# Patient Record
Sex: Female | Born: 1950 | Race: White | Hispanic: No | Marital: Married | Smoking: Former smoker
Health system: Southern US, Community
[De-identification: ages and names within clinical notes are randomized; demographics above are authoritative.]

## PROBLEM LIST (undated history)

## (undated) DIAGNOSIS — Z79890 Hormone replacement therapy (postmenopausal): Secondary | ICD-10-CM

## (undated) DIAGNOSIS — F419 Anxiety disorder, unspecified: Secondary | ICD-10-CM

## (undated) DIAGNOSIS — E785 Hyperlipidemia, unspecified: Secondary | ICD-10-CM

## (undated) DIAGNOSIS — G250 Essential tremor: Secondary | ICD-10-CM

## (undated) DIAGNOSIS — T7840XA Allergy, unspecified, initial encounter: Secondary | ICD-10-CM

## (undated) DIAGNOSIS — F410 Panic disorder [episodic paroxysmal anxiety] without agoraphobia: Secondary | ICD-10-CM

## (undated) HISTORY — DX: Hormone replacement therapy: Z79.890

## (undated) HISTORY — DX: Anxiety disorder, unspecified: F41.9

## (undated) HISTORY — DX: Allergy, unspecified, initial encounter: T78.40XA

## (undated) HISTORY — PX: TONSILLECTOMY: SUR1361

## (undated) HISTORY — DX: Panic disorder (episodic paroxysmal anxiety): F41.0

## (undated) HISTORY — DX: Essential tremor: G25.0

## (undated) HISTORY — DX: Hyperlipidemia, unspecified: E78.5

---

## 2016-12-02 ENCOUNTER — Other Ambulatory Visit: Payer: Self-pay | Admitting: Internal Medicine

## 2016-12-02 DIAGNOSIS — Z130 Encounter for screening for diseases of the blood and blood-forming organs and certain disorders involving the immune mechanism: Secondary | ICD-10-CM

## 2016-12-02 DIAGNOSIS — Z1329 Encounter for screening for other suspected endocrine disorder: Secondary | ICD-10-CM

## 2016-12-02 DIAGNOSIS — Z0000 Encounter for general adult medical examination without abnormal findings: Secondary | ICD-10-CM

## 2016-12-02 DIAGNOSIS — Z13220 Encounter for screening for lipoid disorders: Secondary | ICD-10-CM

## 2016-12-02 DIAGNOSIS — Z1321 Encounter for screening for nutritional disorder: Secondary | ICD-10-CM

## 2016-12-02 LAB — COMPREHENSIVE METABOLIC PANEL
ALBUMIN: 4.2 g/dL (ref 3.6–5.1)
ALT: 26 U/L (ref 6–29)
AST: 20 U/L (ref 10–35)
Alkaline Phosphatase: 28 U/L — ABNORMAL LOW (ref 33–130)
BUN: 14 mg/dL (ref 7–25)
CALCIUM: 8.8 mg/dL (ref 8.6–10.4)
CHLORIDE: 103 mmol/L (ref 98–110)
CO2: 31 mmol/L (ref 20–31)
CREATININE: 0.76 mg/dL (ref 0.50–0.99)
Glucose, Bld: 87 mg/dL (ref 65–99)
POTASSIUM: 4 mmol/L (ref 3.5–5.3)
Sodium: 139 mmol/L (ref 135–146)
TOTAL PROTEIN: 6.7 g/dL (ref 6.1–8.1)
Total Bilirubin: 0.6 mg/dL (ref 0.2–1.2)

## 2016-12-02 LAB — CBC WITH DIFFERENTIAL/PLATELET
BASOS ABS: 0 {cells}/uL (ref 0–200)
Basophils Relative: 0 %
EOS ABS: 90 {cells}/uL (ref 15–500)
Eosinophils Relative: 2 %
HEMATOCRIT: 40.4 % (ref 35.0–45.0)
Hemoglobin: 13.2 g/dL (ref 11.7–15.5)
LYMPHS PCT: 37 %
Lymphs Abs: 1665 cells/uL (ref 850–3900)
MCH: 29.7 pg (ref 27.0–33.0)
MCHC: 32.7 g/dL (ref 32.0–36.0)
MCV: 91 fL (ref 80.0–100.0)
MONO ABS: 225 {cells}/uL (ref 200–950)
MPV: 9.9 fL (ref 7.5–12.5)
Monocytes Relative: 5 %
NEUTROS PCT: 56 %
Neutro Abs: 2520 cells/uL (ref 1500–7800)
Platelets: 150 10*3/uL (ref 140–400)
RBC: 4.44 MIL/uL (ref 3.80–5.10)
RDW: 13.5 % (ref 11.0–15.0)
WBC: 4.5 10*3/uL (ref 3.8–10.8)

## 2016-12-02 LAB — LIPID PANEL
Cholesterol: 212 mg/dL — ABNORMAL HIGH (ref <200)
HDL: 63 mg/dL (ref >50)
LDL CALC: 135 mg/dL — AB (ref <100)
Total CHOL/HDL Ratio: 3.4 Ratio (ref <5.0)
Triglycerides: 70 mg/dL (ref <150)
VLDL: 14 mg/dL (ref <30)

## 2016-12-02 LAB — TSH: TSH: 3.6 mIU/L

## 2016-12-03 LAB — VITAMIN D 25 HYDROXY (VIT D DEFICIENCY, FRACTURES): VIT D 25 HYDROXY: 57 ng/mL (ref 30–100)

## 2016-12-04 ENCOUNTER — Ambulatory Visit (INDEPENDENT_AMBULATORY_CARE_PROVIDER_SITE_OTHER): Payer: BLUE CROSS/BLUE SHIELD | Admitting: Internal Medicine

## 2016-12-04 ENCOUNTER — Encounter: Payer: Self-pay | Admitting: Internal Medicine

## 2016-12-04 VITALS — BP 108/80 | HR 79 | Temp 98.8°F | Ht 63.25 in | Wt 136.0 lb

## 2016-12-04 DIAGNOSIS — E7800 Pure hypercholesterolemia, unspecified: Secondary | ICD-10-CM | POA: Diagnosis not present

## 2016-12-04 DIAGNOSIS — E2839 Other primary ovarian failure: Secondary | ICD-10-CM

## 2016-12-04 DIAGNOSIS — Z0000 Encounter for general adult medical examination without abnormal findings: Secondary | ICD-10-CM

## 2016-12-04 DIAGNOSIS — Z8659 Personal history of other mental and behavioral disorders: Secondary | ICD-10-CM | POA: Diagnosis not present

## 2016-12-04 DIAGNOSIS — G250 Essential tremor: Secondary | ICD-10-CM | POA: Diagnosis not present

## 2016-12-04 LAB — POCT URINALYSIS DIPSTICK
BILIRUBIN UA: NEGATIVE
Blood, UA: NEGATIVE
Glucose, UA: NEGATIVE
KETONES UA: NEGATIVE
LEUKOCYTES UA: NEGATIVE
Nitrite, UA: NEGATIVE
PH UA: 6.5
Protein, UA: NEGATIVE
SPEC GRAV UA: 1.015
Urobilinogen, UA: NEGATIVE

## 2016-12-04 NOTE — Progress Notes (Signed)
Subjective:    Patient ID: Sabrina KingfisherDiana Drumheller, female    DOB: 02/22/1951, 65 y.o.   MRN: [id number]  HPI  65 year old Female moved here from [place], [place] several months ago after retiring. She is here to establish care.  She had a colonoscopy 11/12/2015 with Dr. Evelene CroonAltschuler in [place]. Findings included mild sigmoid diverticulosis, internal hemorrhoid.  Her sister had colon cancer.  She declines flu vaccine.  No recent tetanus immunization that she is aware of.  Mammogram May 2016  She has been on bioidentical hormone cream for some time.  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  Family history: Father living with history of throat cancer. Father also has essential tremor Mother died at age 64 years of age due to complications of dementia. One brother with history of prostate cancer age 64. Sister with history of colon cancer age 64.  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 She is married. Social alcohol consumption. Is a former smoker but hasn't smoked in over 10 years. No children  Allergic to pollen, latex, contrast dye.  No history of serious illnesses accidents or operations.  No known drug allergies. Takes one half of a 20 mg Celexa tablet daily. Is not on statin medication despite history of hyperlipidemia. Total cholesterol was 238 in 2016. HDL cholesterol was 60. Triglycerides were 126. LDL cholesterol was 153.  She takes a number of supplements.  Does not want to be treated for central tremor. It doesn't interfere that much with her activities.  She is a native of [place].       Review of Systems see above     Objective:   Physical Exam  Constitutional: She is oriented to person, place, and time. She appears well-developed and well-nourished. No distress.  HENT:  Head: Normocephalic and atraumatic.  Right  Ear: External ear normal.  Left Ear: External ear normal.  Mouth/Throat: Oropharynx is clear and moist.  Eyes: Conjunctivae and EOM are normal. Pupils are equal, round, and reactive to light. Right eye exhibits no discharge. Left eye exhibits no discharge. No scleral icterus.  Neck: Neck supple. No JVD present. No thyromegaly present.  Cardiovascular: Normal rate, regular rhythm, normal heart sounds and intact distal pulses.   No murmur heard. Pulmonary/Chest: Effort normal and breath sounds normal. She has no wheezes.  Abdominal: Soft. Bowel sounds are normal. She exhibits no distension. There is no tenderness. There is no guarding.  Genitourinary:  Genitourinary Comments: Deferred to GYN  Musculoskeletal: She exhibits no edema.  Lymphadenopathy:    She has no cervical adenopathy.  Neurological: She is alert and oriented to person, place, and time. She has normal reflexes. No cranial nerve deficit. Coordination normal.  Skin: Skin is dry. No rash noted. She is not diaphoretic.  Psychiatric: She has a normal mood and affect. Her behavior is normal. Thought content normal.  Vitals reviewed.         Assessment & Plan:  Normal health maintenance exam  Hyperlipidemia-controlled with supplements. Patient will return in 6 months and have repeat lipid panel for follow-up  Estrogen replacement with bioidentical hormones-she is looking for GYN who will prescribe these for her. Have given her names of several GYN offices in the area and maybe they can help her with this.  History of depression-treated with citalopram 20 mg-she takes one half tablet daily  Family history of colon cancer  and sister-not due for colonoscopy at this point in time. Gastroenterologist in [place] had recommended 5 year follow-up from last colonoscopy due to family history.  Plan: Return in 6 months for follow-up on hyperlipidemia. Order written for mammogram and bone density study.

## 2016-12-04 NOTE — Patient Instructions (Addendum)
OK to RF SSRI when due. Check lipid panel in 6 months. It was a pleasure to see you today.

## 2017-01-27 ENCOUNTER — Other Ambulatory Visit: Payer: Self-pay | Admitting: Internal Medicine

## 2017-01-27 DIAGNOSIS — Z1231 Encounter for screening mammogram for malignant neoplasm of breast: Secondary | ICD-10-CM

## 2017-02-13 ENCOUNTER — Ambulatory Visit
Admission: RE | Admit: 2017-02-13 | Discharge: 2017-02-13 | Disposition: A | Discharge status: Home / Self Care | Payer: BLUE CROSS/BLUE SHIELD | Source: Ambulatory Visit | Attending: Internal Medicine | Admitting: Internal Medicine

## 2017-02-13 DIAGNOSIS — E2839 Other primary ovarian failure: Secondary | ICD-10-CM

## 2017-02-13 DIAGNOSIS — Z1231 Encounter for screening mammogram for malignant neoplasm of breast: Secondary | ICD-10-CM

## 2017-04-29 ENCOUNTER — Telehealth: Payer: Self-pay

## 2017-04-29 MED ORDER — CITALOPRAM HYDROBROMIDE 20 MG PO TABS: ORAL_TABLET | ORAL | 0 refills | Status: DC

## 2017-04-29 NOTE — Telephone Encounter (Signed)
Refill once #30 

## 2017-04-29 NOTE — Telephone Encounter (Signed)
Patient called requesting that her Celexa be filled, she said it would be the first time being filled by you. [facility] [address]. Has a 6 month f/up in 05/2017.

## 2017-04-29 NOTE — Telephone Encounter (Signed)
Done for #30 and called patient and informed her.

## 2017-05-27 ENCOUNTER — Other Ambulatory Visit: Payer: Self-pay | Admitting: Internal Medicine

## 2017-05-27 NOTE — Telephone Encounter (Signed)
Please call pharmacy. Pt told Marcelino DusterMichelle she did not need it. [facility] so there is no confusion please.

## 2017-06-01 ENCOUNTER — Other Ambulatory Visit: Payer: BLUE CROSS/BLUE SHIELD | Admitting: Internal Medicine

## 2017-06-01 DIAGNOSIS — E785 Hyperlipidemia, unspecified: Secondary | ICD-10-CM

## 2017-06-01 LAB — LIPID PANEL
CHOLESTEROL: 190 mg/dL (ref <200)
HDL: 51 mg/dL (ref >50)
LDL Cholesterol: 121 mg/dL — ABNORMAL HIGH (ref <100)
TRIGLYCERIDES: 91 mg/dL (ref <150)
Total CHOL/HDL Ratio: 3.7 Ratio (ref <5.0)
VLDL: 18 mg/dL (ref <30)

## 2017-06-05 ENCOUNTER — Encounter: Payer: Self-pay | Admitting: Internal Medicine

## 2017-06-05 ENCOUNTER — Ambulatory Visit (INDEPENDENT_AMBULATORY_CARE_PROVIDER_SITE_OTHER): Payer: BLUE CROSS/BLUE SHIELD | Admitting: Internal Medicine

## 2017-06-05 VITALS — BP 98/60 | HR 77 | Temp 98.2°F | Wt 137.0 lb

## 2017-06-05 DIAGNOSIS — Z8659 Personal history of other mental and behavioral disorders: Secondary | ICD-10-CM | POA: Diagnosis not present

## 2017-06-05 DIAGNOSIS — E7800 Pure hypercholesterolemia, unspecified: Secondary | ICD-10-CM

## 2017-06-05 NOTE — Patient Instructions (Signed)
It was a pleasure to see you today. Continue with diet and exercise efforts and return January 2019 for Welcome to Medicare physical exam.

## 2017-06-05 NOTE — Progress Notes (Signed)
   Subjective:    Patient ID: Sabrina Price, female    DOB: 02/07/1951, 66 y.o.   MRN: [id number]  HPI She remains on Celexa. Doesn't need refill just yet. Willing to do 90 day refills when drugstore recontacts us.  Here today to follow-up on hyperlipidemia. 6 months ago total cholesterol was 212 and is now 190. LDL cholesterol was 135 and is now 121. She exercises 6 days a week and maintains her weight. She looks good and is enjoying her summer.    Review of Systems see above no new complaints. She will be going on Medicare in October     Objective:   Physical Exam Not examined. Spent 15 minutes speaking with her about these issues       Assessment & Plan:  Mild elevation of LDL  Plan: Continue diet and exercise regimen. She does not want be on statin medication. Physical exam booked for January 2019. Continue same medications

## 2017-07-02 ENCOUNTER — Other Ambulatory Visit: Payer: Self-pay | Admitting: Internal Medicine

## 2017-07-03 ENCOUNTER — Telehealth: Payer: Self-pay | Admitting: Internal Medicine

## 2017-07-07 ENCOUNTER — Other Ambulatory Visit: Payer: Self-pay | Admitting: Internal Medicine

## 2017-12-07 ENCOUNTER — Other Ambulatory Visit: Payer: Self-pay | Admitting: Internal Medicine

## 2017-12-07 DIAGNOSIS — Z1321 Encounter for screening for nutritional disorder: Secondary | ICD-10-CM

## 2017-12-07 DIAGNOSIS — Z1329 Encounter for screening for other suspected endocrine disorder: Secondary | ICD-10-CM

## 2017-12-07 DIAGNOSIS — Z13220 Encounter for screening for lipoid disorders: Secondary | ICD-10-CM

## 2017-12-07 DIAGNOSIS — Z0000 Encounter for general adult medical examination without abnormal findings: Secondary | ICD-10-CM

## 2017-12-21 ENCOUNTER — Other Ambulatory Visit: Payer: Medicare HMO | Admitting: Internal Medicine

## 2017-12-21 DIAGNOSIS — Z1321 Encounter for screening for nutritional disorder: Secondary | ICD-10-CM

## 2017-12-21 DIAGNOSIS — Z13220 Encounter for screening for lipoid disorders: Secondary | ICD-10-CM | POA: Diagnosis not present

## 2017-12-21 DIAGNOSIS — Z1329 Encounter for screening for other suspected endocrine disorder: Secondary | ICD-10-CM | POA: Diagnosis not present

## 2017-12-21 DIAGNOSIS — G250 Essential tremor: Secondary | ICD-10-CM | POA: Diagnosis not present

## 2017-12-21 DIAGNOSIS — E2839 Other primary ovarian failure: Secondary | ICD-10-CM | POA: Diagnosis not present

## 2017-12-21 DIAGNOSIS — Z0000 Encounter for general adult medical examination without abnormal findings: Secondary | ICD-10-CM

## 2017-12-21 DIAGNOSIS — E785 Hyperlipidemia, unspecified: Secondary | ICD-10-CM | POA: Diagnosis not present

## 2017-12-21 DIAGNOSIS — E7800 Pure hypercholesterolemia, unspecified: Secondary | ICD-10-CM | POA: Diagnosis not present

## 2017-12-22 LAB — CBC WITH DIFFERENTIAL/PLATELET
BASOS PCT: 0.4 %
Basophils Absolute: 19 cells/uL (ref 0–200)
EOS ABS: 192 {cells}/uL (ref 15–500)
Eosinophils Relative: 4 %
HCT: 39.4 % (ref 35.0–45.0)
Hemoglobin: 13.3 g/dL (ref 11.7–15.5)
Lymphs Abs: 1493 cells/uL (ref 850–3900)
MCH: 29.5 pg (ref 27.0–33.0)
MCHC: 33.8 g/dL (ref 32.0–36.0)
MCV: 87.4 fL (ref 80.0–100.0)
MONOS PCT: 8 %
MPV: 10.8 fL (ref 7.5–12.5)
NEUTROS PCT: 56.5 %
Neutro Abs: 2712 cells/uL (ref 1500–7800)
PLATELETS: 161 10*3/uL (ref 140–400)
RBC: 4.51 10*6/uL (ref 3.80–5.10)
RDW: 12.4 % (ref 11.0–15.0)
TOTAL LYMPHOCYTE: 31.1 %
WBC mixed population: 384 cells/uL (ref 200–950)
WBC: 4.8 10*3/uL (ref 3.8–10.8)

## 2017-12-22 LAB — LIPID PANEL
CHOL/HDL RATIO: 3.4 (calc) (ref <5.0)
CHOLESTEROL: 225 mg/dL — AB (ref <200)
HDL: 67 mg/dL (ref >50)
LDL CHOLESTEROL (CALC): 139 mg/dL — AB
Non-HDL Cholesterol (Calc): 158 mg/dL (calc) — ABNORMAL HIGH (ref <130)
TRIGLYCERIDES: 86 mg/dL (ref <150)

## 2017-12-22 LAB — COMPLETE METABOLIC PANEL WITH GFR
AG RATIO: 1.6 (calc) (ref 1.0–2.5)
ALKALINE PHOSPHATASE (APISO): 32 U/L — AB (ref 33–130)
ALT: 22 U/L (ref 6–29)
AST: 20 U/L (ref 10–35)
Albumin: 4.5 g/dL (ref 3.6–5.1)
BUN: 18 mg/dL (ref 7–25)
CO2: 33 mmol/L — ABNORMAL HIGH (ref 20–32)
Calcium: 9.5 mg/dL (ref 8.6–10.4)
Chloride: 102 mmol/L (ref 98–110)
Creat: 0.86 mg/dL (ref 0.50–0.99)
GFR, Est African American: 82 mL/min/{1.73_m2} (ref >=60)
GFR, Est Non African American: 71 mL/min/{1.73_m2} (ref >=60)
GLOBULIN: 2.9 g/dL (ref 1.9–3.7)
Glucose, Bld: 92 mg/dL (ref 65–99)
POTASSIUM: 4.4 mmol/L (ref 3.5–5.3)
SODIUM: 140 mmol/L (ref 135–146)
Total Bilirubin: 0.6 mg/dL (ref 0.2–1.2)
Total Protein: 7.4 g/dL (ref 6.1–8.1)

## 2017-12-22 LAB — VITAMIN D 25 HYDROXY (VIT D DEFICIENCY, FRACTURES): Vit D, 25-Hydroxy: 61 ng/mL (ref 30–100)

## 2017-12-22 LAB — TSH: TSH: 2.71 mIU/L (ref 0.40–4.50)

## 2017-12-25 ENCOUNTER — Encounter: Payer: BLUE CROSS/BLUE SHIELD | Admitting: Internal Medicine

## 2018-01-11 ENCOUNTER — Telehealth: Payer: Self-pay | Admitting: Internal Medicine

## 2018-01-11 ENCOUNTER — Encounter: Payer: Self-pay | Admitting: Internal Medicine

## 2018-01-11 ENCOUNTER — Other Ambulatory Visit: Payer: Self-pay | Admitting: Internal Medicine

## 2018-01-11 DIAGNOSIS — Z139 Encounter for screening, unspecified: Secondary | ICD-10-CM

## 2018-01-11 MED ORDER — CITALOPRAM HYDROBROMIDE 20 MG PO TABS: ORAL_TABLET | ORAL | 0 refills | Status: DC

## 2018-01-11 NOTE — Telephone Encounter (Signed)
Pharmacy requesting refill on generic Celexa 20 mg 1/2-1 tablet daily.  Has appointment for physical exam February 22, 2018.  Authorized refill for #90 with no refill.

## 2018-02-15 ENCOUNTER — Ambulatory Visit
Admission: RE | Admit: 2018-02-15 | Discharge: 2018-02-15 | Disposition: A | Discharge status: Home / Self Care | Payer: BLUE CROSS/BLUE SHIELD | Source: Ambulatory Visit | Attending: Internal Medicine | Admitting: Internal Medicine

## 2018-02-15 DIAGNOSIS — Z1231 Encounter for screening mammogram for malignant neoplasm of breast: Secondary | ICD-10-CM | POA: Diagnosis not present

## 2018-02-15 DIAGNOSIS — Z139 Encounter for screening, unspecified: Secondary | ICD-10-CM

## 2018-02-22 ENCOUNTER — Other Ambulatory Visit (HOSPITAL_COMMUNITY)
Admission: RE | Admit: 2018-02-22 | Discharge: 2018-02-22 | Disposition: A | Discharge status: Home / Self Care | Payer: Medicare HMO | Source: Ambulatory Visit | Attending: Internal Medicine | Admitting: Internal Medicine

## 2018-02-22 ENCOUNTER — Encounter: Payer: Self-pay | Admitting: Internal Medicine

## 2018-02-22 ENCOUNTER — Ambulatory Visit (INDEPENDENT_AMBULATORY_CARE_PROVIDER_SITE_OTHER): Payer: Medicare HMO | Admitting: Internal Medicine

## 2018-02-22 VITALS — BP 102/80 | HR 66 | Ht 63.0 in | Wt 137.0 lb

## 2018-02-22 DIAGNOSIS — Z124 Encounter for screening for malignant neoplasm of cervix: Secondary | ICD-10-CM

## 2018-02-22 DIAGNOSIS — R251 Tremor, unspecified: Secondary | ICD-10-CM | POA: Diagnosis not present

## 2018-02-22 DIAGNOSIS — Z8659 Personal history of other mental and behavioral disorders: Secondary | ICD-10-CM

## 2018-02-22 DIAGNOSIS — Z0000 Encounter for general adult medical examination without abnormal findings: Secondary | ICD-10-CM | POA: Diagnosis not present

## 2018-02-22 DIAGNOSIS — E7800 Pure hypercholesterolemia, unspecified: Secondary | ICD-10-CM | POA: Diagnosis not present

## 2018-02-22 LAB — POCT URINALYSIS DIPSTICK
APPEARANCE: NORMAL
Bilirubin, UA: NEGATIVE
Blood, UA: NEGATIVE
Glucose, UA: NEGATIVE
Ketones, UA: NEGATIVE
LEUKOCYTES UA: NEGATIVE
NITRITE UA: NEGATIVE
ODOR: NORMAL
PH UA: 6.5 (ref 5.0–8.0)
PROTEIN UA: NEGATIVE
Spec Grav, UA: 1.015 (ref 1.010–1.025)
UROBILINOGEN UA: 0.2 U/dL

## 2018-02-22 NOTE — Progress Notes (Signed)
Subjective:    Patient ID: Sabrina Price, female    DOB: Mar 14, 1951, 67 y.o.   MRN: [id number]  HPI 67 year old  White Female with history of bilateral hand tremors. Never had MRI to see why.. Lived in [place] almost 30 years ago when the tremor developed left hand and saw neurologist who wanted to do MRI but she did not do it. He thought it was an essential tremor. Pt is right handed. Subsequently developed tremor right hand about 2 years ago.  Wants to see Neurologist. She is here to Welcome to Medicare physical exam, evaluation of medical issues and health maintenance.  She moved from [place] to [place] approximately 2017.  A colonoscopy in December 2016 in [place].  Findings included mild sigmoid diverticulosis and internal hemorrhoid.  She declines flu vaccine.  Last mammogram May 2016.  Has been on bioidentical hormone cream for some time.  History of anxiety, allergic rhinitis,  hyperlipidemia.  She is allergic to pollen, Lasix, contrast dye.  No history of serious illnesses accidents or operations.  No known drug allergies.  Takes one half of a 20 mg Celexa tablet daily.  Is not on statin medication despite history of hyperlipidemia.  She takes a number of supplements.  Social history: She is a native of [place].  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  She has a masters degree in Audiological scientist.  She is married.  Social alcohol consumption.  He is a former smoker but has not smoked in over 10 years.  No children.  Family history: Father living with history of throat cancer.  Father ha apparents essential tremor.  Mother died at 43 years of age due to complications of dementia.  One brother with history of prostate cancer.  Sister with history of colon cancer.        Review of Systems  Constitutional: Negative.   Neurological:       Bilateral hand tremor       Objective:   Physical Exam  Constitutional: She is  oriented to person, place, and time. She appears well-developed and well-nourished. No distress.  HENT:  Head: Normocephalic and atraumatic.  Right Ear: External ear normal.  Left Ear: External ear normal.  Mouth/Throat: Oropharynx is clear and moist.  Eyes: Pupils are equal, round, and reactive to light. Conjunctivae and EOM are normal. Right eye exhibits no discharge. Left eye exhibits no discharge. No scleral icterus.  Neck: Neck supple. No JVD present. No thyromegaly present.  Cardiovascular: Normal rate, regular rhythm and normal heart sounds.  No murmur heard. Pulmonary/Chest: Effort normal and breath sounds normal. No respiratory distress. She has no wheezes. She has no rales.  Breasts normal female  Abdominal: Soft. Bowel sounds are normal. She exhibits no distension and no mass. There is no tenderness. There is no rebound and no guarding.  Genitourinary:  Genitourinary Comments:  Pap taken.  Bimanual normal.  Musculoskeletal: She exhibits no edema.  Lymphadenopathy:    She has no cervical adenopathy.  Neurological: She is alert and oriented to person, place, and time. She has normal reflexes. No cranial nerve deficit.  Bilateral hand tremors but no cogwheel rigidity  Skin: Skin is warm and dry. No rash noted. She is not diaphoretic.  Psychiatric: She has a normal mood and affect. Her behavior is normal. Judgment and thought content normal.  Vitals reviewed.         Assessment & Plan:  Hyperlipidemia-does not want to be on  statin medication  Bilateral hand tremor-to have neurology evaluation  History of anxiety treated with Celexa.  Family history of colon cancer in sister  Estrogen replacement with bioidentical hormones  Plan: Neurology evaluation to be arranged.  Return in 1 year or as needed.  Subjective:   Patient presents for Medicare Annual/Subsequent preventive examination.  Review Past Medical/Family/Social: See above   Risk Factors  Current exercise  habits: Exercises regularly Dietary issues discussed: Low-fat low carbohydrate  Cardiac risk factors: Hyperlipidemia  Depression Screen  (Note: if answer to either of the following is "Yes", a more complete depression screening is indicated)   Over the past two weeks, have you felt down, depressed or hopeless? No  Over the past two weeks, have you felt little interest or pleasure in doing things? No Have you lost interest or pleasure in daily life? No Do you often feel hopeless? No Do you cry easily over simple problems? No   Activities of Daily Living  In your present state of health, do you have any difficulty performing the following activities?:   Driving? No  Managing money? No  Feeding yourself? No  Getting from bed to chair? No  Climbing a flight of stairs? No  Preparing food and eating?: No  Bathing or showering? No  Getting dressed: No  Getting to the toilet? No  Using the toilet:No  Moving around from place to place: No  In the past year have you fallen or had a near fall?:No  Are you sexually active?  Yes Do you have more than one partner? No   Hearing Difficulties: No  Do you often ask people to speak up or repeat themselves? No  Do you experience ringing or noises in your ears? No  Do you have difficulty understanding soft or whispered voices?  Yes Do you feel that you have a problem with memory? No Do you often misplace items? No    Home Safety:  Do you have a smoke alarm at your residence? Yes Do you have grab bars in the bathroom?  No Do you have throw rugs in your house? Yes  Cognitive Testing  Alert? Yes Normal Appearance?Yes  Oriented to person? Yes Place? Yes  Time? Yes  Recall of three objects? Yes  Can perform simple calculations? Yes  Displays appropriate judgment?Yes  Can read the correct time from a watch face?Yes   List the Names of Other Physician/Practitioners you currently use:  See referral list for the physicians patient is  currently seeing.    review of Systems: See above   Objective:     General appearance: Appears stated age  Head: Normocephalic, without obvious abnormality, atraumatic  Eyes: conj clear, EOMi PEERLA  Ears: normal TM's and external ear canals both ears  Nose: Nares normal. Septum midline. Mucosa normal. No drainage or sinus tenderness.  Throat: lips, mucosa, and tongue normal; teeth and gums normal  Neck: no adenopathy, no carotid bruit, no JVD, supple, symmetrical, trachea midline and thyroid not enlarged, symmetric, no tenderness/mass/nodules  No CVA tenderness.  Lungs: clear to auscultation bilaterally  Breasts: normal appearance, no masses or tenderness Heart: regular rate and rhythm, S1, S2 normal, no murmur, click, rub or gallop  Abdomen: soft, non-tender; bowel sounds normal; no masses, no organomegaly  Musculoskeletal: ROM normal in all joints, no crepitus, no deformity, Normal muscle strengthen. Back  is symmetric, no curvature. Skin: Skin color, texture, turgor normal. No rashes or lesions  Lymph nodes: Cervical, supraclavicular, and axillary nodes normal.  Neurologic: CN 2 -12 Normal, Normal symmetric reflexes.  Bilateral hand tremor Psych: Alert & Oriented x 3, Mood appear stable.    Assessment:    Annual wellness medicare exam   Plan:    During the course of the visit the patient was educated and counseled about appropriate screening and preventive services including:   Recommend annual mammogram  Likely due for repeat colonoscopy in 2021  Declines Prevnar 13     Patient Instructions (the written plan) was given to the patient.  Medicare Attestation  I have personally reviewed:  The patient's medical and social history  Their use of alcohol, tobacco or illicit drugs  Their current medications and supplements  The patient's functional ability including ADLs,fall risks, home safety risks, cognitive, and hearing and visual impairment  Diet and physical  activities  Evidence for depression or mood disorders  The patient's weight, height, BMI, and visual acuity have been recorded in the chart. I have made referrals, counseling, and provided education to the patient based on review of the above and I have provided the patient with a written personalized care plan for preventive services.

## 2018-02-24 LAB — CYTOLOGY - PAP: Diagnosis: NEGATIVE

## 2018-02-26 ENCOUNTER — Encounter: Payer: Self-pay | Admitting: Neurology

## 2018-03-06 NOTE — Patient Instructions (Addendum)
To have neurology evaluation regarding hand tremors.  Does not want to be on statin medication.  Continue to watch diet.  Declines Prevnar 13.  Continue Celexa.

## 2018-03-17 NOTE — Progress Notes (Signed)
Subjective:   Sabrina Price was seen in consultation in the movement disorder clinic at the request of Baxley, Luanna Cole, MD.  The evaluation is for tremor.   The records that were made available to me were reviewed.  Tremor started approximately 30+ years ago and involves the bilateral UE.  The L is usually worse than the right.   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   This is about 8 years ago.  No medications were wanted per patient.  Tremor is most noticeable after she drinks coffee.   There is a family hx of tremor in her daughter and niece.    Affected by caffeine:  Yes.   Affected by alcohol:  Not sure but may help Affected by stress:  Yes.   Affected by fatigue:  Is better in the night Spills soup if on spoon:  No. Spills glass of liquid if full:  No. Affects ADL's (tying shoes, brushing teeth, etc):  No. (does affect ability to do makeup)  Current/Previously tried tremor medications: none  Current medications that may exacerbate tremor:  none  Outside reports reviewed: lab reports, office notes and referral letter/letters.  Allergies  Allergen Reactions  . Contrast Media [Iodinated Diagnostic Agents]   . Latex   . Pollen Extract     Outpatient Encounter Medications as of 03/19/2018  Medication Sig  . Biotin w/ Vitamins C & E (HAIR/SKIN/NAILS PO) Take by mouth daily.  Marland Kitchen CALCIUM PO Take by mouth.  . Cholecalciferol (VITAMIN D3) 2000 units TABS Take by mouth daily.  . citalopram (CELEXA) 20 MG tablet TAKES 1/2 OR 1 TABLET DAILY (Patient taking differently: Take 10 mg by mouth daily. )  . MULTIPLE VITAMIN PO Take by mouth.  . Omega-3 Fatty Acids (OMEGA-3 FISH OIL PO) Take by mouth daily.  . [DISCONTINUED] Estriol Micronized POWD Apply topically daily.  . [DISCONTINUED] NON FORMULARY Estriol/Vitamin E Vaginal Suppository  . [DISCONTINUED] NON FORMULARY daily. [facility], Natural Plant Sterol to reduce Cholesterol  . [DISCONTINUED] NON FORMULARY  daily. Calcium magnesium intensive care vitamin   No facility-administered encounter medications on file as of 03/19/2018.     Past Medical History:  Diagnosis Date  . Allergy    Seasonal  . Anxiety   . Dyslipidemia   . Essential tremor   . Hormone replacement therapy   . Panic disorder     Past Surgical History:  Procedure Laterality Date  . TONSILLECTOMY      Social History   Socioeconomic History  . Marital status: Married    Spouse name: Not on file  . Number of children: Not on file  . Years of education: Not on file  . Highest education level: Not on file  Occupational History  . 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 Financial resource strain: Not on file  . Food insecurity:    Worry: Not on file    Inability: Not on file  . Transportation needs:    Medical: Not on file    Non-medical: Not on file  Tobacco Use  . Smoking status: Former Smoker    Last attempt to quit: 12/04/1992    Years since quitting: 25.3  . Smokeless tobacco: Never Used  Substance and Sexual Activity  . Alcohol use: Yes    Comment: 2-3 drinks a month  . Drug use: No  . Sexual activity: Yes    Partners: Male  Birth control/protection: None  Lifestyle  . Physical activity:    Days per week: Not on file    Minutes per session: Not on file  . Stress: Not on file  Relationships  . Social connections:    Talks on phone: Not on file    Gets together: Not on file    Attends religious service: Not on file    Active member of club or organization: Not on file    Attends meetings of clubs or organizations: Not on file    Relationship status: Not on file  . Intimate partner violence:    Fear of current or ex partner: Not on file    Emotionally abused: Not on file    Physically abused: Not on file    Forced sexual activity: Not on file  Other Topics Concern  . Not on file  Social History Narrative  . Not on file    Family Status    Relation Name Status  . Mother  Deceased  . Brother  Deceased  . Mat Aunt  (Not Specified)  . Father  Deceased  . Sister  Alive    Review of Systems A complete 10 system ROS was obtained and was negative apart from what is mentioned.   Objective:   VITALS:   Vitals:   03/19/18 1318  BP: 100/72  Pulse: 68  SpO2: 97%  Weight: 134 lb (60.8 kg)  Height: 5\' 4"  (1.626 m)   Gen:  Appears stated age and in NAD. HEENT:  Normocephalic, atraumatic. The mucous membranes are moist. The superficial temporal arteries are without ropiness or tenderness. Cardiovascular: Regular rate and rhythm. Lungs: Clear to auscultation bilaterally. Neck: There are no carotid bruits noted bilaterally.  NEUROLOGICAL:  Orientation:  The patient is alert and oriented x 3.  Recent and remote memory are intact.  Attention span and concentration are normal.  Able to name objects and repeat without trouble.  Fund of knowledge is appropriate Cranial nerves: There is good facial symmetry. The pupils are equal round and reactive to light bilaterally. Fundoscopic exam reveals clear disc margins bilaterally. Extraocular muscles are intact and visual fields are full to confrontational testing. Speech is fluent and clear. Soft palate rises symmetrically and there is no tongue deviation. Hearing is intact to conversational tone. Tone: Tone is good throughout. Sensation: Sensation is intact to light touch and pinprick throughout (facial, trunk, extremities). Vibration is intact at the bilateral big toe. There is no extinction with double simultaneous stimulation. There is no sensory dermatomal level identified. Coordination:  The patient has no dysdiadichokinesia or dysmetria. Motor: Strength is 5/5 in the bilateral upper and lower extremities.  Shoulder shrug is equal bilaterally.  There is no pronator drift.  There are no fasciculations noted. DTR's: Deep tendon reflexes are 2/4 at the bilateral biceps, triceps,  brachioradialis, patella and achilles.  Plantar responses are downgoing bilaterally. Gait and Station: The patient is able to ambulate without difficulty. The patient is able to heel toe walk without any difficulty. The patient is able to ambulate in a tandem fashion. The patient is able to stand in the Romberg position.   MOVEMENT EXAM: Tremor:  There is tremor in the UE, noted most significantly with action.  It is present bilaterally, left somewhat worse than right.  It is overall mild.  The patient is able to draw Archimedes spirals without significant difficulty but tremor is evident.  There is no tremor at rest.  The patient is able to pour  water from one glass to another without spilling it but tremor is evident.  She has mild head tremor in the "yes" direction.  Recent Results (from the past 2160 hour(s))  VITAMIN D 25 Hydroxy (Vit-D Deficiency, Fractures)     Status: None   Collection Time: 12/21/17 10:17 AM  Result Value Ref Range   Vit D, 25-Hydroxy 61 30 - 100 ng/mL    Comment: Vitamin D Status         25-OH Vitamin D: . Deficiency:                    <20 ng/mL Insufficiency:             20 - 29 ng/mL Optimal:                 > or = 30 ng/mL . For 25-OH Vitamin D testing on patients on  D2-supplementation and patients for whom quantitation  of D2 and D3 fractions is required, the QuestAssureD(TM) 25-OH VIT D, (D2,D3), LC/MS/MS is recommended: order  code [id number] (patients >6926yrs). . For more information on this test, go to: http://education.quest[facility].com/faq/FAQ163 (This link is being provided for  informational/educational purposes only.)   TSH     Status: None   Collection Time: 12/21/17 10:17 AM  Result Value Ref Range   TSH 2.71 0.40 - 4.50 mIU/L  Lipid panel     Status: Abnormal   Collection Time: 12/21/17 10:17 AM  Result Value Ref Range   Cholesterol 225 (H) <200 mg/dL   HDL 67 >60>50 mg/dL   Triglycerides 86 <454<150 mg/dL   LDL Cholesterol (Calc) 139 (H) mg/dL  (calc)    Comment: Reference range: <100 . Desirable range <100 mg/dL for primary prevention;   <70 mg/dL for patients with CHD or diabetic patients  with > or = 2 CHD risk factors. Marland Kitchen. LDL-C is now calculated using the Martin-Hopkins  calculation, which is a validated novel method providing  better accuracy than the Friedewald equation in the  estimation of LDL-C.  Horald PollenMartin SS et al. Lenox AhrJAMA. 0981;191(472013;310(19): 2061-2068  (http://education.[facility].com/faq/FAQ164)    Total CHOL/HDL Ratio 3.4 <5.0 (calc)   Non-HDL Cholesterol (Calc) 158 (H) <130 mg/dL (calc)    Comment: For patients with diabetes plus 1 major ASCVD risk  factor, treating to a non-HDL-C goal of <100 mg/dL  (LDL-C of <82<70 mg/dL) is considered a therapeutic  option.   COMPLETE METABOLIC PANEL WITH GFR     Status: Abnormal   Collection Time: 12/21/17 10:17 AM  Result Value Ref Range   Glucose, Bld 92 65 - 99 mg/dL    Comment: .            Fasting reference interval .    BUN 18 7 - 25 mg/dL   Creat 9.560.86 2.130.50 - 0.860.99 mg/dL    Comment: For patients >749 years of age, the reference limit for Creatinine is approximately 13% higher for people identified as African-American. .    GFR, Est Non African American 71 > OR = 60 mL/min/1.6773m2   GFR, Est African American 82 > OR = 60 mL/min/1.5873m2   BUN/Creatinine Ratio NOT APPLICABLE 6 - 22 (calc)   Sodium 140 135 - 146 mmol/L   Potassium 4.4 3.5 - 5.3 mmol/L   Chloride 102 98 - 110 mmol/L   CO2 33 (H) 20 - 32 mmol/L   Calcium 9.5 8.6 - 10.4 mg/dL   Total Protein 7.4 6.1 - 8.1 g/dL   Albumin 4.5 3.6 -  5.1 g/dL   Globulin 2.9 1.9 - 3.7 g/dL (calc)   AG Ratio 1.6 1.0 - 2.5 (calc)   Total Bilirubin 0.6 0.2 - 1.2 mg/dL   Alkaline phosphatase (APISO) 32 (L) 33 - 130 U/L   AST 20 10 - 35 U/L   ALT 22 6 - 29 U/L  CBC with Differential/Platelet     Status: None   Collection Time: 12/21/17 10:17 AM  Result Value Ref Range   WBC 4.8 3.8 - 10.8 Thousand/uL   RBC 4.51 3.80 - 5.10  Million/uL   Hemoglobin 13.3 11.7 - 15.5 g/dL   HCT 16.1 09.6 - 04.5 %   MCV 87.4 80.0 - 100.0 fL   MCH 29.5 27.0 - 33.0 pg   MCHC 33.8 32.0 - 36.0 g/dL   RDW 40.9 81.1 - 91.4 %   Platelets 161 140 - 400 Thousand/uL   MPV 10.8 7.5 - 12.5 fL   Neutro Abs 2,712 1,500 - 7,800 cells/uL   Lymphs Abs 1,493 850 - 3,900 cells/uL   WBC mixed population 384 200 - 950 cells/uL   Eosinophils Absolute 192 15 - 500 cells/uL   Basophils Absolute 19 0 - 200 cells/uL   Neutrophils Relative % 56.5 %   Total Lymphocyte 31.1 %   Monocytes Relative 8.0 %   Eosinophils Relative 4.0 %   Basophils Relative 0.4 %  Cytology - PAP     Status: None   Collection Time: 02/22/18 12:00 AM  Result Value Ref Range   Adequacy      Satisfactory for evaluation  endocervical/transformation zone component PRESENT.   Diagnosis      NEGATIVE FOR INTRAEPITHELIAL LESIONS OR MALIGNANCY.   Material Submitted CervicoVaginal Pap [ThinPrep Imaged]    CYTOLOGY - PAP PAP RESULT   POCT urinalysis dipstick     Status: Normal   Collection Time: 02/22/18  3:49 PM  Result Value Ref Range   Color, UA YELLOW    Clarity, UA CLEAR    Glucose, UA NEG    Bilirubin, UA NEG    Ketones, UA NEG    Spec Grav, UA 1.015 1.010 - 1.025   Blood, UA NEG    pH, UA 6.5 5.0 - 8.0   Protein, UA NEG    Urobilinogen, UA 0.2 0.2 or 1.0 E.U./dL   Nitrite, UA NEG    Leukocytes, UA Negative Negative   Appearance NORMAL    Odor NORMAL        Assessment/Plan:   1.  Essential Tremor.  -This is evidenced by the symmetrical nature and longstanding hx of gradually getting worse.  We discussed nature and pathophysiology.  We discussed that this can continue to gradually get worse with time.  We discussed that some medications can worsen this, as can caffeine use.  We discussed medication therapy as well as surgical therapy.  She asked specifics about medication, but ultimately decided to hold off on any.  She asked several questions, including if this  was related to Parkinson's disease.  I expressed that I saw no evidence of a neurodegenerative disorder, including Parkinson's disease.  She was very worried about need to have an MRI of the brain, as she clearly stated to me that she would have refused that.  Apparently, her old neurologist told her 8 years ago that she needed one.  Her neurologic exam was nonfocal and nonlateralizing, and she has had tremor for over 30 years.  I do not see need today for her to have an  MRI of the brain.  She really has no complaints that would warrant that.  She was given patient education on essential tremor.  She will follow-up here on an as-needed basis.  Much greater than 50% of this visit was spent in counseling and coordinating care.  Total face to face time:  45 min  CC:  Baxley, Luanna Cole, MD

## 2018-03-19 ENCOUNTER — Encounter: Payer: Self-pay | Admitting: Neurology

## 2018-03-19 ENCOUNTER — Ambulatory Visit: Payer: Medicare HMO | Admitting: Neurology

## 2018-03-19 VITALS — BP 100/72 | HR 68 | Ht 64.0 in | Wt 134.0 lb

## 2018-03-19 DIAGNOSIS — G250 Essential tremor: Secondary | ICD-10-CM

## 2018-05-30 ENCOUNTER — Other Ambulatory Visit: Payer: Self-pay | Admitting: Internal Medicine

## 2018-05-30 MED ORDER — CITALOPRAM HYDROBROMIDE 20 MG PO TABS: ORAL_TABLET | ORAL | 1 refills | Status: DC

## 2018-07-19 DIAGNOSIS — L821 Other seborrheic keratosis: Secondary | ICD-10-CM | POA: Diagnosis not present

## 2018-07-19 DIAGNOSIS — Z1283 Encounter for screening for malignant neoplasm of skin: Secondary | ICD-10-CM | POA: Diagnosis not present

## 2018-08-27 ENCOUNTER — Ambulatory Visit (INDEPENDENT_AMBULATORY_CARE_PROVIDER_SITE_OTHER): Payer: Medicare HMO | Admitting: Internal Medicine

## 2018-08-27 ENCOUNTER — Encounter: Payer: Self-pay | Admitting: Internal Medicine

## 2018-08-27 VITALS — BP 90/80 | HR 73 | Temp 98.2°F | Ht 64.0 in | Wt 131.0 lb

## 2018-08-27 DIAGNOSIS — M545 Low back pain, unspecified: Secondary | ICD-10-CM

## 2018-08-27 MED ORDER — MELOXICAM 15 MG PO TABS: ORAL_TABLET | ORAL | 0 refills | Status: DC

## 2018-08-27 NOTE — Progress Notes (Signed)
   Subjective:    Patient ID: Sabrina Price, female    DOB: 08/03/1951, 67 y.o.   MRN: [id number]  [facility]PI [facility] [employer] and [facility] 6 classes a week.  She is in 3 weightlifting classes a week.  Was lifting about 50 pounds and felt acute pain in her mid back about 2 weeks ago.  She likes going to the gym.  She does not want to stop but her back is been very sore for 2 weeks.  She took a little ibuprofen but it did not help very much and she did not really stay with it.  She thought it would just get better on its own but it has not.  She is moving slowly.  Says it does not radiate down her legs.  Denies weakness or numbness in her legs or feet.  Says that she has seen a chiropractor in the remote past for back injury and was told she had spondylolisthesis.    Review of Systems no loss of bladder or bowel function     Objective:   Physical Exam Straight leg raising at 90 degrees bilaterally is positive worse on the left than the right.  Muscle strength is 5/5 in the lower extremities.  Sensation is intact in the lower extremities       Assessment & Plan:  Acute mid low back pain without sciatica  Plan: We discussed various options including physical therapy, prednisone, muscle relaxants, meloxicam.  She is decided to try meloxicam 15 mg daily.  Does not want to go to physical therapy.  I talked with her about the importance of not going back to the gym too soon and not overdoing it when she does go back.  She needs to be pain-free before she returns and does not need to be doing heavy weightlifting when she initially goes back to the gym.  Meloxicam 15 mg (#30) one tab p.o. daily with a meal.  Call if not improving in 7 to 10 days or sooner if worse.

## 2018-08-28 NOTE — Patient Instructions (Signed)
Take meloxicam daily and call if not better in 7- 10days. Stay out of gym until pain free. Careful with weight lifting in the future.

## 2018-09-19 ENCOUNTER — Other Ambulatory Visit: Payer: Self-pay | Admitting: Internal Medicine

## 2018-09-30 ENCOUNTER — Encounter: Payer: Self-pay | Admitting: Internal Medicine

## 2018-09-30 ENCOUNTER — Ambulatory Visit (INDEPENDENT_AMBULATORY_CARE_PROVIDER_SITE_OTHER): Payer: Medicare HMO | Admitting: Internal Medicine

## 2018-09-30 VITALS — BP 106/60 | HR 69 | Temp 98.0°F | Ht 64.0 in | Wt 131.0 lb

## 2018-09-30 DIAGNOSIS — R197 Diarrhea, unspecified: Secondary | ICD-10-CM

## 2018-09-30 NOTE — Progress Notes (Signed)
   Subjective:    Patient ID: Sabrina Price, female    DOB: [birth year], 67 y.o.   MRN: [id number]  HPI 67 year old Female in today with complaint of diarrhea that started on trip to [place] recently.  She was last here September 20 with acute mid low back pain without sciatica that responded to anti-inflammatory medication.  Patient did not recall eating anything that would have triggered diarrhea.  No prior history of diarrhea.  Now happening on a daily basis typically in the mornings after drinking coffee with cream.  No blood in bowel movement.  No melena.  No bright red blood per rectum.    Review of Systems no fever or chills.     Objective:   Physical Exam  Abdomen is soft nondistended without hepatosplenomegaly masses or tenderness      Assessment & Plan:  Diarrhea occurring shortly after meals.  Doubt she has parasite but have given her lab supplies to return for parasite check etc.  I feel this is most likely irritable bowel syndrome-D.  After stool studies are submitted we could consider Levsin before meals if the studies are negative.  15 minutes spent with patient

## 2018-09-30 NOTE — Patient Instructions (Signed)
Submit stool studies then trial of clear liquids and advance diet slowly.

## 2018-10-15 ENCOUNTER — Other Ambulatory Visit: Payer: Self-pay | Admitting: Internal Medicine

## 2018-10-15 DIAGNOSIS — R197 Diarrhea, unspecified: Secondary | ICD-10-CM

## 2018-10-19 LAB — CLOSTRIDIUM DIFFICILE TOXIN B, QUALITATIVE, REAL-TIME PCR

## 2018-10-19 LAB — STOOL CULTURE
MICRO NUMBER: 91348034
MICRO NUMBER:: 91348035
MICRO NUMBER:: 91348036
SHIGA RESULT: NOT DETECTED
SPECIMEN QUALITY:: ADEQUATE
SPECIMEN QUALITY:: ADEQUATE
SPECIMEN QUALITY:: ADEQUATE

## 2018-10-19 LAB — FECAL LACTOFERRIN, QUANT

## 2018-10-20 LAB — OVA AND PARASITE EXAMINATION
CONCENTRATE RESULT:: NONE SEEN
MICRO NUMBER:: 91348038
SPECIMEN QUALITY:: ADEQUATE
TRICHROME RESULT:: NONE SEEN

## 2018-11-19 ENCOUNTER — Other Ambulatory Visit: Payer: Self-pay | Admitting: Internal Medicine

## 2019-02-25 ENCOUNTER — Ambulatory Visit (INDEPENDENT_AMBULATORY_CARE_PROVIDER_SITE_OTHER): Payer: Medicare HMO | Admitting: Internal Medicine

## 2019-02-25 ENCOUNTER — Telehealth: Payer: Self-pay

## 2019-02-25 VITALS — Temp 100.1°F

## 2019-02-25 DIAGNOSIS — R509 Fever, unspecified: Secondary | ICD-10-CM

## 2019-02-25 DIAGNOSIS — B974 Respiratory syncytial virus as the cause of diseases classified elsewhere: Secondary | ICD-10-CM

## 2019-02-25 DIAGNOSIS — J22 Unspecified acute lower respiratory infection: Secondary | ICD-10-CM | POA: Diagnosis not present

## 2019-02-25 DIAGNOSIS — B338 Other specified viral diseases: Secondary | ICD-10-CM

## 2019-02-25 LAB — POCT INFLUENZA A/B
INFLUENZA A, POC: NEGATIVE
Influenza B, POC: NEGATIVE

## 2019-02-25 MED ORDER — DOXYCYCLINE HYCLATE 100 MG PO TABS: 100.0000 mg | ORAL_TABLET | Freq: Two times a day (BID) | ORAL | 0 refills | Status: DC

## 2019-02-25 MED ORDER — HYDROCODONE-HOMATROPINE 5-1.5 MG/5ML PO SYRP: 5.0000 mL | ORAL_SOLUTION | Freq: Three times a day (TID) | ORAL | 0 refills | Status: DC | PRN

## 2019-02-25 NOTE — Telephone Encounter (Signed)
Yes but if we do Respiratory virus panel and flu test here then send her on to the tent if she wants coronovirus testing

## 2019-02-25 NOTE — Patient Instructions (Addendum)
Rapid flu test is negative.  Respiratory virus panel pending.  Doxycycline 100 mg twice daily for 10 days.  Hycodan 1 teaspoon p.o. every 8 hours as needed cough.  Rest and drink plenty of fluids.  Tylenol for fever.  Addendum: Respiratory virus panel reveals respiratory syncytial virus.  Patient contacted with results

## 2019-02-25 NOTE — Telephone Encounter (Signed)
Patient is calling she has a cough and congestion since Sunday she thinks she has a fever she checked her temp a few minutes ago and it was 99.8. she travelled to [place] last month for 3 weeks came back on 2/28. Do you want to see her in her car?

## 2019-02-25 NOTE — Progress Notes (Signed)
   Subjective:    Patient ID: Km. Francavilla, female    DOB: 04/22/1951, 68 y.o.   MRN: [id number]  HPI 68 year old Female seen in her vehicle today due to Coronavirus outbreak. Has fever, cough and congestion. Was in [place] for about 3 months. Returned late February.  Cough is productive with discolored sputum.  No SOB.  She is anxious.   No chills or myalgias. No vomiting or diarrhea. No sore throat but nasally congested.    Review of Systems see above     Objective:   Physical Exam Seen in car.  Temperature 100.1 degrees.  Respiratory virus panel obtained.  Rapid flu test negative.  15 minutes spent with patient with swabs obtained for rapid flu test and respiratory virus panel.  Discussion regarding diagnosis and treatment.     Assessment & Plan:  Acute lower respiratory infection  Rapid flu test negative  Respiratory virus panel obtained and pending.  Plan: Doxycycline 100 mg twice daily for 10 days.  Hycodan 1 teaspoon p.o. every 8 hours as needed cough.  Rest and drink plenty of fluids.  Addendum: Has respiratory syncytial virus on respiratory virus panel.  Patient contacted and reassured she should be improving.

## 2019-02-28 LAB — RESPIRATORY VIRUS PANEL
ADENOVIRUS B: NOT DETECTED
HUMAN PARAINFLU VIRUS 1: NOT DETECTED
HUMAN PARAINFLU VIRUS 2: NOT DETECTED
HUMAN PARAINFLU VIRUS 3: NOT DETECTED
INFLUENZA A SUBTYPE H1: NOT DETECTED
INFLUENZA A SUBTYPE H3: NOT DETECTED
Influenza A: NOT DETECTED
Influenza B: NOT DETECTED
Metapneumovirus: NOT DETECTED
RESPIRATORY SYNCYTIAL VIRUS A: DETECTED — AB
Respiratory Syncytial Virus B: NOT DETECTED
Rhinovirus: NOT DETECTED

## 2019-03-03 NOTE — Telephone Encounter (Signed)
Patient calling to get respiratory panel results, she said she is better and has no fever.

## 2019-03-03 NOTE — Telephone Encounter (Signed)
Called pt. She has RSV on Respiratory virus panel. She is better. Expect continued improvement.

## 2019-03-05 ENCOUNTER — Encounter: Payer: Self-pay | Admitting: Internal Medicine

## 2019-04-04 ENCOUNTER — Telehealth: Payer: Self-pay | Admitting: Internal Medicine

## 2019-04-04 NOTE — Telephone Encounter (Signed)
LVM to CB and schedule CPE< AWV and Labs

## 2019-04-04 NOTE — Telephone Encounter (Signed)
Sabrina Price called back and scheduled CPE

## 2019-05-19 ENCOUNTER — Other Ambulatory Visit: Payer: Medicare HMO | Admitting: Internal Medicine

## 2019-05-19 ENCOUNTER — Other Ambulatory Visit: Payer: Self-pay

## 2019-05-19 DIAGNOSIS — E7800 Pure hypercholesterolemia, unspecified: Secondary | ICD-10-CM

## 2019-05-19 DIAGNOSIS — Z0000 Encounter for general adult medical examination without abnormal findings: Secondary | ICD-10-CM

## 2019-05-19 DIAGNOSIS — R251 Tremor, unspecified: Secondary | ICD-10-CM | POA: Diagnosis not present

## 2019-05-20 LAB — CBC WITH DIFFERENTIAL/PLATELET
Absolute Monocytes: 359 cells/uL (ref 200–950)
Basophils Absolute: 29 cells/uL (ref 0–200)
Basophils Relative: 0.5 %
Eosinophils Absolute: 103 cells/uL (ref 15–500)
Eosinophils Relative: 1.8 %
HCT: 38.6 % (ref 35.0–45.0)
Hemoglobin: 12.7 g/dL (ref 11.7–15.5)
Lymphs Abs: 2029 cells/uL (ref 850–3900)
MCH: 30.1 pg (ref 27.0–33.0)
MCHC: 32.9 g/dL (ref 32.0–36.0)
MCV: 91.5 fL (ref 80.0–100.0)
MPV: 11 fL (ref 7.5–12.5)
Monocytes Relative: 6.3 %
Neutro Abs: 3181 cells/uL (ref 1500–7800)
Neutrophils Relative %: 55.8 %
Platelets: 159 10*3/uL (ref 140–400)
RBC: 4.22 10*6/uL (ref 3.80–5.10)
RDW: 12.6 % (ref 11.0–15.0)
Total Lymphocyte: 35.6 %
WBC: 5.7 10*3/uL (ref 3.8–10.8)

## 2019-05-20 LAB — COMPLETE METABOLIC PANEL WITH GFR
AG Ratio: 1.5 (calc) (ref 1.0–2.5)
ALT: 32 U/L — ABNORMAL HIGH (ref 6–29)
AST: 27 U/L (ref 10–35)
Albumin: 4.3 g/dL (ref 3.6–5.1)
Alkaline phosphatase (APISO): 33 U/L — ABNORMAL LOW (ref 37–153)
BUN: 20 mg/dL (ref 7–25)
CO2: 29 mmol/L (ref 20–32)
Calcium: 9.4 mg/dL (ref 8.6–10.4)
Chloride: 102 mmol/L (ref 98–110)
Creat: 0.83 mg/dL (ref 0.50–0.99)
GFR, Est African American: 85 mL/min/{1.73_m2} (ref >=60)
GFR, Est Non African American: 73 mL/min/{1.73_m2} (ref >=60)
Globulin: 2.9 g/dL (calc) (ref 1.9–3.7)
Glucose, Bld: 87 mg/dL (ref 65–99)
Potassium: 4.9 mmol/L (ref 3.5–5.3)
Sodium: 140 mmol/L (ref 135–146)
Total Bilirubin: 0.5 mg/dL (ref 0.2–1.2)
Total Protein: 7.2 g/dL (ref 6.1–8.1)

## 2019-05-20 LAB — LIPID PANEL
Cholesterol: 189 mg/dL (ref <200)
HDL: 63 mg/dL (ref >=50)
LDL Cholesterol (Calc): 111 mg/dL (calc) — ABNORMAL HIGH
Non-HDL Cholesterol (Calc): 126 mg/dL (calc) (ref <130)
Total CHOL/HDL Ratio: 3 (calc) (ref <5.0)
Triglycerides: 60 mg/dL (ref <150)

## 2019-05-20 LAB — TSH: TSH: 1.28 mIU/L (ref 0.40–4.50)

## 2019-05-23 ENCOUNTER — Other Ambulatory Visit: Payer: Self-pay

## 2019-05-23 ENCOUNTER — Ambulatory Visit (INDEPENDENT_AMBULATORY_CARE_PROVIDER_SITE_OTHER): Payer: Medicare HMO | Admitting: Internal Medicine

## 2019-05-23 ENCOUNTER — Encounter: Payer: Self-pay | Admitting: Internal Medicine

## 2019-05-23 VITALS — BP 122/74 | HR 70 | Temp 97.3°F | Ht 64.0 in | Wt 134.0 lb

## 2019-05-23 DIAGNOSIS — R251 Tremor, unspecified: Secondary | ICD-10-CM | POA: Diagnosis not present

## 2019-05-23 DIAGNOSIS — Z8659 Personal history of other mental and behavioral disorders: Secondary | ICD-10-CM | POA: Diagnosis not present

## 2019-05-23 DIAGNOSIS — R350 Frequency of micturition: Secondary | ICD-10-CM

## 2019-05-23 DIAGNOSIS — Z0000 Encounter for general adult medical examination without abnormal findings: Secondary | ICD-10-CM | POA: Diagnosis not present

## 2019-05-23 DIAGNOSIS — Z23 Encounter for immunization: Secondary | ICD-10-CM

## 2019-05-23 DIAGNOSIS — E7800 Pure hypercholesterolemia, unspecified: Secondary | ICD-10-CM | POA: Diagnosis not present

## 2019-05-23 LAB — POCT URINALYSIS DIP (CLINITEK)
Bilirubin, UA: NEGATIVE
Blood, UA: NEGATIVE
Glucose, UA: NEGATIVE mg/dL
Ketones, POC UA: NEGATIVE mg/dL
Leukocytes, UA: NEGATIVE
Nitrite, UA: NEGATIVE
POC PROTEIN,UA: NEGATIVE
Spec Grav, UA: 1.015 (ref 1.010–1.025)
Urobilinogen, UA: NEGATIVE E.U./dL — AB
pH, UA: 5 (ref 5.0–8.0)

## 2019-05-23 NOTE — Progress Notes (Signed)
Subjective:    Patient ID: Sabrina Price, female    DOB: [birth year], 68 y.o.   MRN: [id number]  HPI 68 year old Female for [employer], health maintenance exam and evaluation of medical issues.  She has a history of bilateral hand tremors.  She moved from [place] to [place] around 2017.  A colonoscopy December 2016 in [place] showing mild sigmoid diverticulosis and internal hemorrhoids.  She has declined flu vaccine.  Is been on bioidentical hormone cream for some time.  History of anxiety, allergic rhinitis, hyperlipidemia.  No history of serious illnesses accidents or operations.  No known drug allergies.  Takes Celexa for anxiety.  She takes a number of supplements.  Has not wanted to be on statin medication.  Social history: He is a native of [place].  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  She has a masters degree in Press photographer.  She is married.  No alcohol consumption.  She is a former smoker but has not smoked in over 11 years.  No children.  Family history: Father with history of throat cancer essential tremor.  Mother died at age 41 due to complications of dementia.  One brother with history of prostate cancer.  Sister with history of colon cancer.    Review of Systems  Respiratory: Negative.   Cardiovascular: Negative.   Gastrointestinal: Negative.   Neurological:       History of bilateral hand tremors  Psychiatric/Behavioral:       History of anxiety treated with Celexa       Objective:   Physical Exam Constitutional:      General: She is not in acute distress.    Appearance: Normal appearance.  HENT:     Head: Normocephalic and atraumatic.     Right Ear: Tympanic membrane normal.     Left Ear: Tympanic membrane normal.     Nose: Nose normal.     Mouth/Throat:     Mouth: Mucous membranes are moist.     Pharynx: Oropharynx is clear.  Eyes:     General: No scleral icterus.    Extraocular Movements:  Extraocular movements intact.     Conjunctiva/sclera: Conjunctivae normal.     Pupils: Pupils are equal, round, and reactive to light.  Neck:     Musculoskeletal: Neck supple. No neck rigidity.  Cardiovascular:     Rate and Rhythm: Normal rate and regular rhythm.     Heart sounds: No murmur.  Pulmonary:     Effort: Pulmonary effort is normal. No respiratory distress.     Breath sounds: Normal breath sounds. No wheezing or rales.  Abdominal:     General: Bowel sounds are normal.     Palpations: Abdomen is soft. There is no mass.     Tenderness: There is no guarding or rebound.  Genitourinary:    Comments: Pap taken 2019.  Bimanual normal. Musculoskeletal:     Right lower leg: No edema.     Left lower leg: No edema.  Lymphadenopathy:     Cervical: No cervical adenopathy.  Neurological:     General: No focal deficit present.     Mental Status: She is alert and oriented to person, place, and time.     Cranial Nerves: No cranial nerve deficit.  Psychiatric:        Mood and Affect: Mood normal.        Behavior: Behavior normal.        Thought Content: Thought content normal.  Judgment: Judgment normal.           Assessment & Plan:  Mild elevation of LDL cholesterol at 111.  Improvement from last year when it was 139.  She does not want to be on statin therapy.  History of bilateral hand tremors-likely essential tremor.  No treatment at present time.  History of anxiety treated with Celexa  Plan: Continue diet and exercise efforts.  Return in 1 year or as needed.  Prevnar 13 given today.  She has a very mild elevation of her liver functions which may be related to some supplements she is taking.  I would like for her to repeat liver panel in 3 months and she will check to see if over-the-counter cholesterol supplements are contributing to this.   Subjective:   Patient presents for Medicare Annual/Subsequent preventive examination.  Review Past Medical/Family/Social:  See above  Risk Factors  Current exercise habits:  Dietary issues discussed:   Cardiac risk factors: Mild hyperlipidemia  Depression Screen  (Note: if answer to either of the following is "Yes", a more complete depression screening is indicated)   Over the past two weeks, have you felt down, depressed or hopeless? No  Over the past two weeks, have you felt little interest or pleasure in doing things? No Have you lost interest or pleasure in daily life? No Do you often feel hopeless? No Do you cry easily over simple problems? No   Activities of Daily Living  In your present state of health, do you have any difficulty performing the following activities?:   Driving? No  Managing money? No  Feeding yourself? No  Getting from bed to chair? No  Climbing a flight of stairs? No  Preparing food and eating?: No  Bathing or showering? No  Getting dressed: No  Getting to the toilet? No  Using the toilet:No  Moving around from place to place: No  In the past year have you fallen or had a near fall?:No  Are you sexually active? No  Do you have more than one partner? No   Hearing Difficulties: No  Do you often ask people to speak up or repeat themselves? No  Do you experience ringing or noises in your ears? No  Do you have difficulty understanding soft or whispered voices? No  Do you feel that you have a problem with memory?  Sometimes Do you often misplace items? No    Home Safety:  Do you have a smoke alarm at your residence? Yes Do you have grab bars in the bathroom?  None Do you have throw rugs in your house? Yes  Cognitive Testing  Alert? Yes Normal Appearance?Yes  Oriented to person? Yes Place? Yes  Time? Yes  Recall of three objects? Yes  Can perform simple calculations? Yes  Displays appropriate judgment?Yes  Can read the correct time from a watch face?Yes   List the Names of Other Physician/Practitioners you currently use:  See referral list for the physicians  patient is currently seeing.     Review of Systems: See above   Objective:     General appearance: Appears stated age and mildly obese  Head: Normocephalic, without obvious abnormality, atraumatic  Eyes: conj clear, EOMi PEERLA  Ears: normal TM's and external ear canals both ears  Nose: Nares normal. Septum midline. Mucosa normal. No drainage or sinus tenderness.  Throat: lips, mucosa, and tongue normal; teeth and gums normal  Neck: no adenopathy, no carotid bruit, no JVD, supple, symmetrical, trachea midline  and thyroid not enlarged, symmetric, no tenderness/mass/nodules  No CVA tenderness.  Lungs: clear to auscultation bilaterally  Breasts: normal appearance, no masses or tenderness Heart: regular rate and rhythm, S1, S2 normal, no murmur, click, rub or gallop  Abdomen: soft, non-tender; bowel sounds normal; no masses, no organomegaly  Musculoskeletal: ROM normal in all joints, no crepitus, no deformity, Normal muscle strengthen. Back  is symmetric, no curvature. Skin: Skin color, texture, turgor normal. No rashes or lesions  Lymph nodes: Cervical, supraclavicular, and axillary nodes normal.  Neurologic: CN 2 -12 Normal, Normal symmetric reflexes.  Mild essential tremor normal coordination and gait  Psych: Alert & Oriented x 3, Mood appear stable.    Assessment:    Annual wellness medicare exam   Plan:    During the course of the visit the patient was educated and counseled about appropriate screening and preventive services including:   Recommend annual flu vaccine and annual mammogram.     Patient Instructions (the written plan) was given to the patient.  Medicare Attestation  I have personally reviewed:  The patient's medical and social history  Their use of alcohol, tobacco or illicit drugs  Their current medications and supplements  The patient's functional ability including ADLs,fall risks, home safety risks, cognitive, and hearing and visual impairment  Diet  and physical activities  Evidence for depression or mood disorders  The patient's weight, height, BMI, and visual acuity have been recorded in the chart. I have made referrals, counseling, and provided education to the patient based on review of the above and I have provided the patient with a written personalized care plan for preventive services.

## 2019-05-23 NOTE — Patient Instructions (Addendum)
Prevnar 13 given. RTC in 3 months to have repeat LFTs and OV. Pt to Check on OTC cholesterol supplements to see if they are contributing to elevated LFT

## 2019-06-06 DIAGNOSIS — H524 Presbyopia: Secondary | ICD-10-CM | POA: Diagnosis not present

## 2019-06-16 DIAGNOSIS — Z0100 Encounter for examination of eyes and vision without abnormal findings: Secondary | ICD-10-CM | POA: Diagnosis not present

## 2019-07-20 DIAGNOSIS — Z0100 Encounter for examination of eyes and vision without abnormal findings: Secondary | ICD-10-CM | POA: Diagnosis not present

## 2019-07-26 DIAGNOSIS — R69 Illness, unspecified: Secondary | ICD-10-CM | POA: Diagnosis not present

## 2019-09-06 ENCOUNTER — Other Ambulatory Visit: Payer: Self-pay

## 2019-09-06 ENCOUNTER — Encounter: Payer: Self-pay | Admitting: Internal Medicine

## 2019-09-06 ENCOUNTER — Ambulatory Visit (INDEPENDENT_AMBULATORY_CARE_PROVIDER_SITE_OTHER): Payer: Medicare HMO | Admitting: Internal Medicine

## 2019-09-06 VITALS — BP 110/70 | HR 76 | Ht 64.0 in | Wt 138.0 lb

## 2019-09-06 DIAGNOSIS — Z23 Encounter for immunization: Secondary | ICD-10-CM

## 2019-09-06 DIAGNOSIS — E7800 Pure hypercholesterolemia, unspecified: Secondary | ICD-10-CM

## 2019-09-06 LAB — HEPATIC FUNCTION PANEL
AG Ratio: 1.7 (calc) (ref 1.0–2.5)
ALT: 22 U/L (ref 6–29)
AST: 23 U/L (ref 10–35)
Albumin: 4.5 g/dL (ref 3.6–5.1)
Alkaline phosphatase (APISO): 34 U/L — ABNORMAL LOW (ref 37–153)
Bilirubin, Direct: 0.1 mg/dL (ref 0.0–0.2)
Globulin: 2.7 g/dL (calc) (ref 1.9–3.7)
Indirect Bilirubin: 0.4 mg/dL (calc) (ref 0.2–1.2)
Total Bilirubin: 0.5 mg/dL (ref 0.2–1.2)
Total Protein: 7.2 g/dL (ref 6.1–8.1)

## 2019-09-06 NOTE — Progress Notes (Signed)
   Subjective:    Patient ID: Jackee Glasner, female    DOB: Jun 20, 1951, 68 y.o.   MRN: [id number]  HPI 68 year old Female seen for follow-up on mild elevation of LDL of 111 in June.  In January 2019 total cholesterol was 225 with an LDL cholesterol of 139.  Patient did not want to be on statin medication.  She exercises and watches her weight.  Flu vaccine given today.  Also when she was here in June her SGPT was slightly elevated at 32 normal being between 6 and 29.  Her alkaline phosphatase was low at 33.  Lipid panel was repeated today.  Review of Systems     Objective:   Physical Exam  Not examined but spent 10 minutes speaking with her about this issue of hyperlipidemia.  Lipid panel drawn.        Assessment & Plan:  Elevated LDL cholesterol treated with diet and exercise  Mild elevation of SGPT at 32.  Liver panel repeated today  Health maintenance today flu vaccine given today.  Plan: Review labs and make further recommendations.

## 2019-09-06 NOTE — Patient Instructions (Signed)
Flu vaccine given.  Lipid panel and liver functions drawn today in follow-up of mild elevation of SGPT at 32 and mild elevation of LDL at 111.

## 2019-09-08 ENCOUNTER — Ambulatory Visit (INDEPENDENT_AMBULATORY_CARE_PROVIDER_SITE_OTHER): Payer: Medicare HMO | Admitting: Internal Medicine

## 2019-09-08 ENCOUNTER — Telehealth: Payer: Self-pay | Admitting: Internal Medicine

## 2019-09-08 ENCOUNTER — Encounter: Payer: Self-pay | Admitting: Internal Medicine

## 2019-09-08 VITALS — Ht 64.0 in | Wt 138.0 lb

## 2019-09-08 DIAGNOSIS — M545 Low back pain, unspecified: Secondary | ICD-10-CM

## 2019-09-08 MED ORDER — MELOXICAM 15 MG PO TABS: 15.0000 mg | ORAL_TABLET | Freq: Every day | ORAL | 0 refills | Status: AC

## 2019-09-08 MED ORDER — CYCLOBENZAPRINE HCL 10 MG PO TABS: ORAL_TABLET | ORAL | 0 refills | Status: AC

## 2019-09-08 NOTE — Progress Notes (Signed)
   Subjective:    Patient ID: Sabrina Price, female    DOB: Oct 29, 1951, 68 y.o.   MRN: [id number]  HPI 68 year old Female seen today via interactive audio and video telecommunications due to the coronavirus pandemic and due to her inability to ambulate as she has injured her back.  Patient has been going to the gym doing outdoor exercises.  It was some vigorous exercise today with leg extension types of exercises and she acutely injured her lower back.  She is having trouble moving easily at the present time and is in a lot of pain.  She is identified is Sabrina Price a patient in this practice using 2 identifiers and is agreeable to visit in this format today.  She is in acute pain and it is difficult for her to sit still for the interview due to pain.  She has no history of serious illnesses accidents or operations.  Interestingly in September 2019, she was lifting 50 pounds at [facility] and felt acute pain in her mid back.  Was seen for acute back pain without sciatica.  Physical therapy was offered but she declined and she was treated with meloxicam and improved.  She has never had imaging studies of her back.    Review of Systems is not having radiculopathy symptoms just acute severe back pain in lumbar area.  Denies weakness or numbness in the lower extremities.  Decreased range of motion due to pain.     Objective:   Physical Exam  She is having difficulty sitting still for the interview due to pain.  Shee is able to rise her legs minimally and does not appear to have radicular symptoms at this time but severe lower back spasm is preventing her from cooperating with an exam virtually Decreased range of motion in trunk due to pain and spasm.     Assessment & Plan:  Acute lower back pain with spasm  Plan: We talked about various options including prednisone, hydrocodone, muscle relaxant or just anti-inflammatory medication and she chose once again to try Meloxicam.  She will be  treated with Meloxicam 15 mg daily and call if not improving.

## 2019-09-08 NOTE — Telephone Encounter (Signed)
Scheduled

## 2019-09-08 NOTE — Telephone Encounter (Signed)
OK 

## 2019-09-08 NOTE — Telephone Encounter (Signed)
Pt called and said she hurt her back this morning and she cant sit down and is unable to get in her car, she wants to know if she could do a virtual appt, she said she was diagnosed when she lived in [place] with a dislocated vertabri

## 2019-09-21 DIAGNOSIS — M5137 Other intervertebral disc degeneration, lumbosacral region: Secondary | ICD-10-CM | POA: Diagnosis not present

## 2019-09-21 DIAGNOSIS — M9905 Segmental and somatic dysfunction of pelvic region: Secondary | ICD-10-CM | POA: Diagnosis not present

## 2019-09-21 DIAGNOSIS — M25551 Pain in right hip: Secondary | ICD-10-CM | POA: Diagnosis not present

## 2019-09-21 DIAGNOSIS — M9903 Segmental and somatic dysfunction of lumbar region: Secondary | ICD-10-CM | POA: Diagnosis not present

## 2019-09-22 DIAGNOSIS — M9905 Segmental and somatic dysfunction of pelvic region: Secondary | ICD-10-CM | POA: Diagnosis not present

## 2019-09-22 DIAGNOSIS — M25551 Pain in right hip: Secondary | ICD-10-CM | POA: Diagnosis not present

## 2019-09-22 DIAGNOSIS — M9903 Segmental and somatic dysfunction of lumbar region: Secondary | ICD-10-CM | POA: Diagnosis not present

## 2019-09-22 DIAGNOSIS — M5137 Other intervertebral disc degeneration, lumbosacral region: Secondary | ICD-10-CM | POA: Diagnosis not present

## 2019-09-26 DIAGNOSIS — M25551 Pain in right hip: Secondary | ICD-10-CM | POA: Diagnosis not present

## 2019-09-26 DIAGNOSIS — M9905 Segmental and somatic dysfunction of pelvic region: Secondary | ICD-10-CM | POA: Diagnosis not present

## 2019-09-26 DIAGNOSIS — M9903 Segmental and somatic dysfunction of lumbar region: Secondary | ICD-10-CM | POA: Diagnosis not present

## 2019-09-26 DIAGNOSIS — M5137 Other intervertebral disc degeneration, lumbosacral region: Secondary | ICD-10-CM | POA: Diagnosis not present

## 2019-09-28 DIAGNOSIS — M9905 Segmental and somatic dysfunction of pelvic region: Secondary | ICD-10-CM | POA: Diagnosis not present

## 2019-09-28 DIAGNOSIS — M9903 Segmental and somatic dysfunction of lumbar region: Secondary | ICD-10-CM | POA: Diagnosis not present

## 2019-09-28 DIAGNOSIS — M5137 Other intervertebral disc degeneration, lumbosacral region: Secondary | ICD-10-CM | POA: Diagnosis not present

## 2019-09-28 DIAGNOSIS — M25551 Pain in right hip: Secondary | ICD-10-CM | POA: Diagnosis not present

## 2019-09-29 DIAGNOSIS — M25551 Pain in right hip: Secondary | ICD-10-CM | POA: Diagnosis not present

## 2019-09-29 DIAGNOSIS — M5137 Other intervertebral disc degeneration, lumbosacral region: Secondary | ICD-10-CM | POA: Diagnosis not present

## 2019-09-29 DIAGNOSIS — M9905 Segmental and somatic dysfunction of pelvic region: Secondary | ICD-10-CM | POA: Diagnosis not present

## 2019-09-29 DIAGNOSIS — M9903 Segmental and somatic dysfunction of lumbar region: Secondary | ICD-10-CM | POA: Diagnosis not present

## 2019-10-02 NOTE — Patient Instructions (Signed)
Apply heat or ice to lower back as needed.  Meloxicam 15 mg with a meal once daily.  Call if symptoms not improving in 24 to 48 hours or sooner if worse.  Stay away from gym exercises for 7 to 10 days.  Avoid heavy lifting.

## 2019-10-03 ENCOUNTER — Encounter: Payer: Self-pay | Admitting: Internal Medicine

## 2019-10-03 ENCOUNTER — Telehealth: Payer: Self-pay | Admitting: Internal Medicine

## 2019-10-03 ENCOUNTER — Other Ambulatory Visit: Payer: Self-pay

## 2019-10-03 ENCOUNTER — Ambulatory Visit (INDEPENDENT_AMBULATORY_CARE_PROVIDER_SITE_OTHER): Payer: Medicare HMO | Admitting: Internal Medicine

## 2019-10-03 VITALS — Temp 98.2°F | Ht 64.0 in | Wt 138.0 lb

## 2019-10-03 DIAGNOSIS — M5137 Other intervertebral disc degeneration, lumbosacral region: Secondary | ICD-10-CM | POA: Diagnosis not present

## 2019-10-03 DIAGNOSIS — J029 Acute pharyngitis, unspecified: Secondary | ICD-10-CM

## 2019-10-03 DIAGNOSIS — M9905 Segmental and somatic dysfunction of pelvic region: Secondary | ICD-10-CM | POA: Diagnosis not present

## 2019-10-03 DIAGNOSIS — M25551 Pain in right hip: Secondary | ICD-10-CM | POA: Diagnosis not present

## 2019-10-03 DIAGNOSIS — M9903 Segmental and somatic dysfunction of lumbar region: Secondary | ICD-10-CM | POA: Diagnosis not present

## 2019-10-03 NOTE — Progress Notes (Signed)
   Subjective:    Patient ID: Sabrina Price, female    DOB: 02/08/1951, 68 y.o.   MRN: [id number]  HPI 68 year old Female recently seen October 1 for back pain. Is now seeing chiropractor, Dr. Jola Baptist for that and is improving.  Now has developed sore throat and is seen today for that complaint.  Due to the Coronavirus pandemic, she is seen today by interactive audio and video telecommunications.  She is identified using 2 identifiers as Sabrina Price, a patient in this practice.  She is agreeable to visit in this format today.  Patient says she went to yoga on Friday, October 23.  The yoga class was held outdoors.  Subsequently, her voice became hoarse and was worse by Friday evening.  She developed left-sided sore throat.  No fever.  No chills.  No ear pain.  No headache.  No postnasal drip or runny nose. Has noticed some tenderness in her right anterior neck area and feels that that gland may be slightly swollen.  I have asked her to look into her throat and she does not see any abnormality there.    Review of Systems see above     Objective:   Physical Exam  She is seen today by interactive audio and video telecommunications in no acute distress.  She is not hoarse at the present time.  Does not sound nasally congested.  Reports that she is afebrile.      Assessment & Plan:  Acute left pharyngitis-likely viral  Plan: Patient should gargle with warm salt water and take Tylenol for pain.  She should stay away from yoga class this week.  I do not feel that the COVID-19 test is indicated at this point in time.  She will monitor her symptoms and call if she has any questions.  Expect symptoms to be present 7 to 10 days.

## 2019-10-03 NOTE — Telephone Encounter (Signed)
Sabrina Price [phone]  Jazzelle called to say that on Friday her voice and throat was dry, then on Saturday throat became really sore on the left side, no fever, no COVID exposure, no other symptoms.

## 2019-10-03 NOTE — Telephone Encounter (Signed)
LVM on cell and home phone to call back and schedule virtual visit.

## 2019-10-03 NOTE — Telephone Encounter (Signed)
Virtual visit 

## 2019-10-03 NOTE — Telephone Encounter (Signed)
LVM to CB as soon as possible to schedule Virtual visit

## 2019-10-03 NOTE — Patient Instructions (Signed)
Take Tylenol if needed for pain.  Recommend gargling with warm salt water 3-4 times daily for sore throat pain.  Call if symptoms worsen or do not improve.  Do not go to yoga class this week.

## 2019-10-03 NOTE — Telephone Encounter (Signed)
Scheduled virtual Visit °

## 2019-10-04 DIAGNOSIS — M5137 Other intervertebral disc degeneration, lumbosacral region: Secondary | ICD-10-CM | POA: Diagnosis not present

## 2019-10-04 DIAGNOSIS — M25551 Pain in right hip: Secondary | ICD-10-CM | POA: Diagnosis not present

## 2019-10-04 DIAGNOSIS — M9903 Segmental and somatic dysfunction of lumbar region: Secondary | ICD-10-CM | POA: Diagnosis not present

## 2019-10-04 DIAGNOSIS — M9905 Segmental and somatic dysfunction of pelvic region: Secondary | ICD-10-CM | POA: Diagnosis not present

## 2019-10-05 DIAGNOSIS — M9905 Segmental and somatic dysfunction of pelvic region: Secondary | ICD-10-CM | POA: Diagnosis not present

## 2019-10-05 DIAGNOSIS — M5137 Other intervertebral disc degeneration, lumbosacral region: Secondary | ICD-10-CM | POA: Diagnosis not present

## 2019-10-05 DIAGNOSIS — M25551 Pain in right hip: Secondary | ICD-10-CM | POA: Diagnosis not present

## 2019-10-05 DIAGNOSIS — M9903 Segmental and somatic dysfunction of lumbar region: Secondary | ICD-10-CM | POA: Diagnosis not present

## 2019-10-10 DIAGNOSIS — M9903 Segmental and somatic dysfunction of lumbar region: Secondary | ICD-10-CM | POA: Diagnosis not present

## 2019-10-10 DIAGNOSIS — M9905 Segmental and somatic dysfunction of pelvic region: Secondary | ICD-10-CM | POA: Diagnosis not present

## 2019-10-10 DIAGNOSIS — M25551 Pain in right hip: Secondary | ICD-10-CM | POA: Diagnosis not present

## 2019-10-10 DIAGNOSIS — M5137 Other intervertebral disc degeneration, lumbosacral region: Secondary | ICD-10-CM | POA: Diagnosis not present

## 2019-10-11 DIAGNOSIS — M5137 Other intervertebral disc degeneration, lumbosacral region: Secondary | ICD-10-CM | POA: Diagnosis not present

## 2019-10-11 DIAGNOSIS — M25551 Pain in right hip: Secondary | ICD-10-CM | POA: Diagnosis not present

## 2019-10-11 DIAGNOSIS — M9903 Segmental and somatic dysfunction of lumbar region: Secondary | ICD-10-CM | POA: Diagnosis not present

## 2019-10-11 DIAGNOSIS — M9905 Segmental and somatic dysfunction of pelvic region: Secondary | ICD-10-CM | POA: Diagnosis not present

## 2019-10-13 DIAGNOSIS — M25551 Pain in right hip: Secondary | ICD-10-CM | POA: Diagnosis not present

## 2019-10-13 DIAGNOSIS — M5137 Other intervertebral disc degeneration, lumbosacral region: Secondary | ICD-10-CM | POA: Diagnosis not present

## 2019-10-13 DIAGNOSIS — M9905 Segmental and somatic dysfunction of pelvic region: Secondary | ICD-10-CM | POA: Diagnosis not present

## 2019-10-13 DIAGNOSIS — M9903 Segmental and somatic dysfunction of lumbar region: Secondary | ICD-10-CM | POA: Diagnosis not present

## 2019-10-17 DIAGNOSIS — M9905 Segmental and somatic dysfunction of pelvic region: Secondary | ICD-10-CM | POA: Diagnosis not present

## 2019-10-17 DIAGNOSIS — M25551 Pain in right hip: Secondary | ICD-10-CM | POA: Diagnosis not present

## 2019-10-17 DIAGNOSIS — M5137 Other intervertebral disc degeneration, lumbosacral region: Secondary | ICD-10-CM | POA: Diagnosis not present

## 2019-10-17 DIAGNOSIS — M9903 Segmental and somatic dysfunction of lumbar region: Secondary | ICD-10-CM | POA: Diagnosis not present

## 2019-10-18 DIAGNOSIS — M9905 Segmental and somatic dysfunction of pelvic region: Secondary | ICD-10-CM | POA: Diagnosis not present

## 2019-10-18 DIAGNOSIS — M25551 Pain in right hip: Secondary | ICD-10-CM | POA: Diagnosis not present

## 2019-10-18 DIAGNOSIS — M9903 Segmental and somatic dysfunction of lumbar region: Secondary | ICD-10-CM | POA: Diagnosis not present

## 2019-10-18 DIAGNOSIS — M5137 Other intervertebral disc degeneration, lumbosacral region: Secondary | ICD-10-CM | POA: Diagnosis not present

## 2019-10-20 DIAGNOSIS — M9903 Segmental and somatic dysfunction of lumbar region: Secondary | ICD-10-CM | POA: Diagnosis not present

## 2019-10-20 DIAGNOSIS — M25551 Pain in right hip: Secondary | ICD-10-CM | POA: Diagnosis not present

## 2019-10-20 DIAGNOSIS — M5137 Other intervertebral disc degeneration, lumbosacral region: Secondary | ICD-10-CM | POA: Diagnosis not present

## 2019-10-20 DIAGNOSIS — M9905 Segmental and somatic dysfunction of pelvic region: Secondary | ICD-10-CM | POA: Diagnosis not present

## 2019-10-24 DIAGNOSIS — M9905 Segmental and somatic dysfunction of pelvic region: Secondary | ICD-10-CM | POA: Diagnosis not present

## 2019-10-24 DIAGNOSIS — M25551 Pain in right hip: Secondary | ICD-10-CM | POA: Diagnosis not present

## 2019-10-24 DIAGNOSIS — M9903 Segmental and somatic dysfunction of lumbar region: Secondary | ICD-10-CM | POA: Diagnosis not present

## 2019-10-24 DIAGNOSIS — M5137 Other intervertebral disc degeneration, lumbosacral region: Secondary | ICD-10-CM | POA: Diagnosis not present

## 2019-10-25 DIAGNOSIS — M25551 Pain in right hip: Secondary | ICD-10-CM | POA: Diagnosis not present

## 2019-10-25 DIAGNOSIS — M5137 Other intervertebral disc degeneration, lumbosacral region: Secondary | ICD-10-CM | POA: Diagnosis not present

## 2019-10-25 DIAGNOSIS — M9905 Segmental and somatic dysfunction of pelvic region: Secondary | ICD-10-CM | POA: Diagnosis not present

## 2019-10-25 DIAGNOSIS — M9903 Segmental and somatic dysfunction of lumbar region: Secondary | ICD-10-CM | POA: Diagnosis not present

## 2019-10-26 DIAGNOSIS — M5137 Other intervertebral disc degeneration, lumbosacral region: Secondary | ICD-10-CM | POA: Diagnosis not present

## 2019-10-26 DIAGNOSIS — M9905 Segmental and somatic dysfunction of pelvic region: Secondary | ICD-10-CM | POA: Diagnosis not present

## 2019-10-26 DIAGNOSIS — M9903 Segmental and somatic dysfunction of lumbar region: Secondary | ICD-10-CM | POA: Diagnosis not present

## 2019-10-26 DIAGNOSIS — M25551 Pain in right hip: Secondary | ICD-10-CM | POA: Diagnosis not present

## 2019-10-27 DIAGNOSIS — M9905 Segmental and somatic dysfunction of pelvic region: Secondary | ICD-10-CM | POA: Diagnosis not present

## 2019-10-27 DIAGNOSIS — M25551 Pain in right hip: Secondary | ICD-10-CM | POA: Diagnosis not present

## 2019-10-27 DIAGNOSIS — M9903 Segmental and somatic dysfunction of lumbar region: Secondary | ICD-10-CM | POA: Diagnosis not present

## 2019-10-27 DIAGNOSIS — M5137 Other intervertebral disc degeneration, lumbosacral region: Secondary | ICD-10-CM | POA: Diagnosis not present

## 2019-10-31 DIAGNOSIS — M25551 Pain in right hip: Secondary | ICD-10-CM | POA: Diagnosis not present

## 2019-10-31 DIAGNOSIS — M9905 Segmental and somatic dysfunction of pelvic region: Secondary | ICD-10-CM | POA: Diagnosis not present

## 2019-10-31 DIAGNOSIS — M9903 Segmental and somatic dysfunction of lumbar region: Secondary | ICD-10-CM | POA: Diagnosis not present

## 2019-10-31 DIAGNOSIS — M5137 Other intervertebral disc degeneration, lumbosacral region: Secondary | ICD-10-CM | POA: Diagnosis not present

## 2019-11-02 DIAGNOSIS — M9903 Segmental and somatic dysfunction of lumbar region: Secondary | ICD-10-CM | POA: Diagnosis not present

## 2019-11-02 DIAGNOSIS — M25551 Pain in right hip: Secondary | ICD-10-CM | POA: Diagnosis not present

## 2019-11-02 DIAGNOSIS — M5137 Other intervertebral disc degeneration, lumbosacral region: Secondary | ICD-10-CM | POA: Diagnosis not present

## 2019-11-02 DIAGNOSIS — M9905 Segmental and somatic dysfunction of pelvic region: Secondary | ICD-10-CM | POA: Diagnosis not present

## 2020-02-10 ENCOUNTER — Ambulatory Visit: Payer: Medicare HMO | Attending: Internal Medicine

## 2020-02-10 DIAGNOSIS — Z23 Encounter for immunization: Secondary | ICD-10-CM | POA: Insufficient documentation

## 2020-02-10 NOTE — Progress Notes (Signed)
   Covid-19 Vaccination Clinic  Name:  Norie Latendresse    MRN: [id number] DOB: Mar 20, 1951  02/10/2020  Ms. Shiffer was observed post Covid-19 immunization for 15 minutes without incident. She was provided with Vaccine Information Sheet and instruction to access the V-Safe system.   Ms. Goetzke was instructed to call 911 with any severe reactions post vaccine: Marland Kitchen Difficulty breathing  . Swelling of face and throat  . A fast heartbeat  . A bad rash all over body  . Dizziness and weakness

## 2020-02-14 ENCOUNTER — Other Ambulatory Visit: Payer: Self-pay

## 2020-02-14 DIAGNOSIS — Z1231 Encounter for screening mammogram for malignant neoplasm of breast: Secondary | ICD-10-CM

## 2020-02-14 DIAGNOSIS — E2839 Other primary ovarian failure: Secondary | ICD-10-CM

## 2020-03-06 ENCOUNTER — Ambulatory Visit: Payer: Medicare HMO | Attending: Internal Medicine

## 2020-03-06 DIAGNOSIS — Z23 Encounter for immunization: Secondary | ICD-10-CM

## 2020-03-06 NOTE — Progress Notes (Signed)
   Covid-19 Vaccination Clinic  Name:  Kierrah Kilbride    MRN: [id number] DOB: 1951-03-02  03/06/2020  Ms. Eichholz was observed post Covid-19 immunization for 15 minutes without incident. She was provided with Vaccine Information Sheet and instruction to access the V-Safe system.   Ms. Crader was instructed to call 911 with any severe reactions post vaccine: Marland Kitchen Difficulty breathing  . Swelling of face and throat  . A fast heartbeat  . A bad rash all over body  . Dizziness and weakness   Immunizations Administered    Name Date Dose VIS Date Route   Pfizer COVID-19 Vaccine 03/06/2020 11:01 AM 0.3 mL 11/18/2019 Intramuscular   Manufacturer: [employer], [employer]   Lot: [id number]   NDC: [address]-4

## 2020-05-02 ENCOUNTER — Ambulatory Visit
Admission: RE | Admit: 2020-05-02 | Discharge: 2020-05-02 | Disposition: A | Discharge status: Home / Self Care | Payer: Medicare HMO | Source: Ambulatory Visit | Attending: Internal Medicine | Admitting: Internal Medicine

## 2020-05-02 ENCOUNTER — Other Ambulatory Visit: Payer: Self-pay

## 2020-05-02 DIAGNOSIS — E2839 Other primary ovarian failure: Secondary | ICD-10-CM

## 2020-05-02 DIAGNOSIS — M8589 Other specified disorders of bone density and structure, multiple sites: Secondary | ICD-10-CM | POA: Diagnosis not present

## 2020-05-02 DIAGNOSIS — Z780 Asymptomatic menopausal state: Secondary | ICD-10-CM | POA: Diagnosis not present

## 2020-05-02 DIAGNOSIS — Z1231 Encounter for screening mammogram for malignant neoplasm of breast: Secondary | ICD-10-CM

## 2020-05-25 ENCOUNTER — Other Ambulatory Visit: Payer: Medicare HMO | Admitting: Internal Medicine

## 2020-05-28 ENCOUNTER — Encounter: Payer: Medicare HMO | Admitting: Internal Medicine

## 2020-06-25 ENCOUNTER — Telehealth: Payer: Self-pay | Admitting: Internal Medicine

## 2020-06-25 NOTE — Telephone Encounter (Signed)
Patient called to let us know she has moved to [place], and is in the process of changing PCP's. She has already contacted [facility] and has chosen PCP in [place] and has appointment with them in August.

## 2020-07-25 ENCOUNTER — Telehealth: Payer: Self-pay | Admitting: Internal Medicine

## 2020-07-25 DIAGNOSIS — Z029 Encounter for administrative examinations, unspecified: Secondary | ICD-10-CM

## 2020-07-25 NOTE — Telephone Encounter (Signed)
Faxed 28 pages of medical records to DR Renda Rolls at [facility]  [facility] [address] [place], [place] [address]

## 2021-08-05 IMAGING — MG DIGITAL SCREENING BILAT W/ TOMO W/ CAD
8 series · 8 of 24 positions shown · non-contrast
Comparison: Previous exam(s).

CLINICAL DATA: Screening.

EXAM:
DIGITAL SCREENING BILATERAL MAMMOGRAM WITH TOMO AND CAD

[R MLO synth-2D]
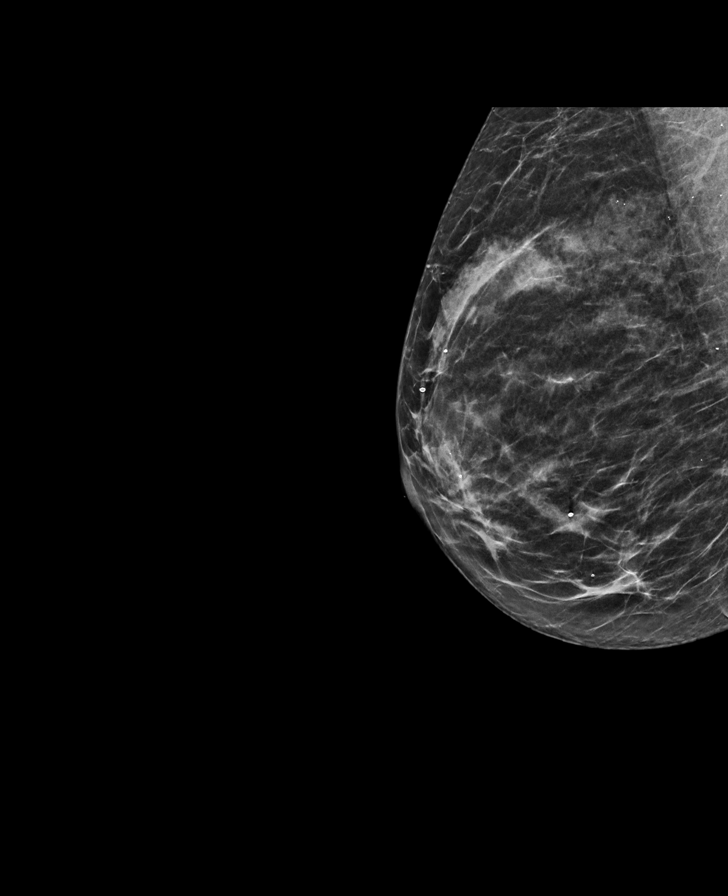

[L CC synth-2D]
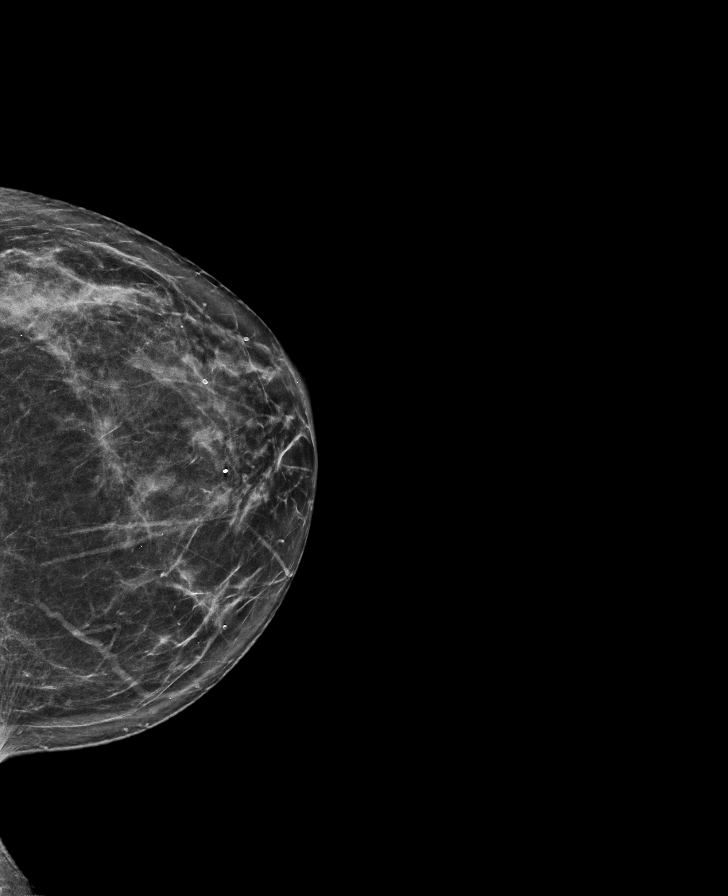

[L MLO synth-2D]
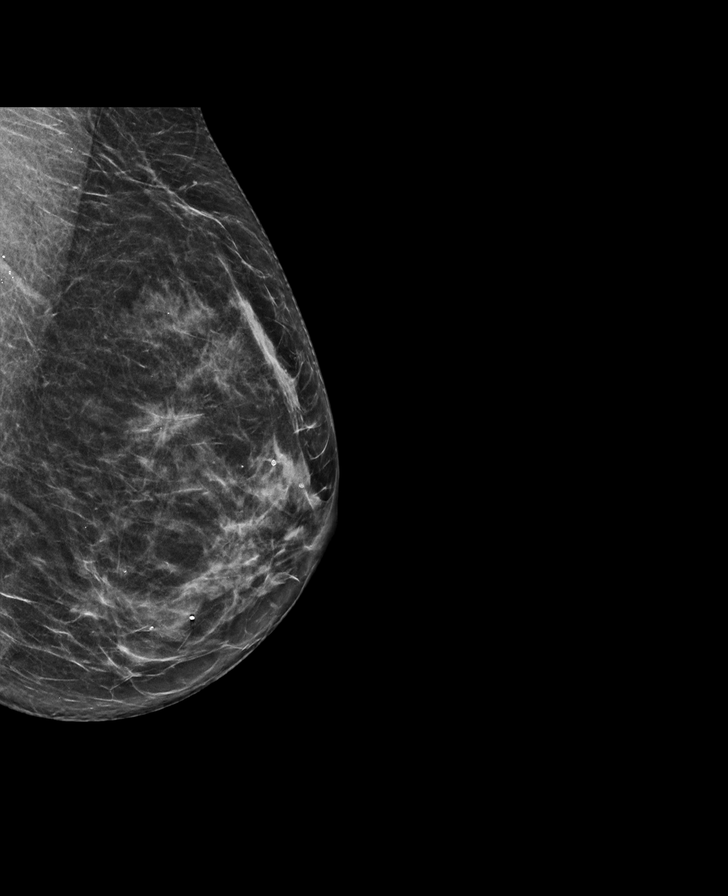

[R CC synth-2D]
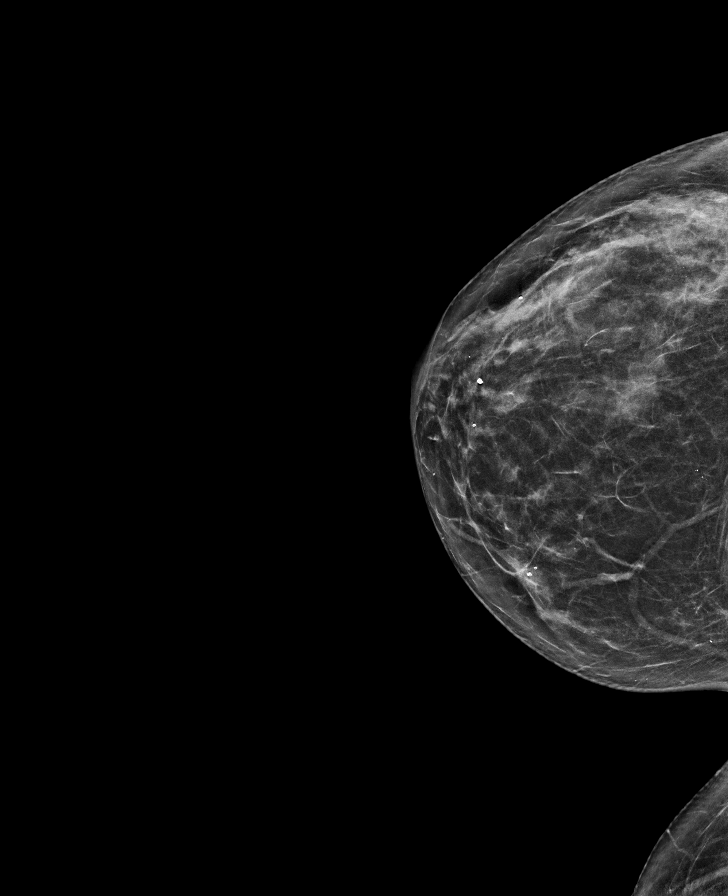

[L MLO tomo · tomo slice 31/61.0]
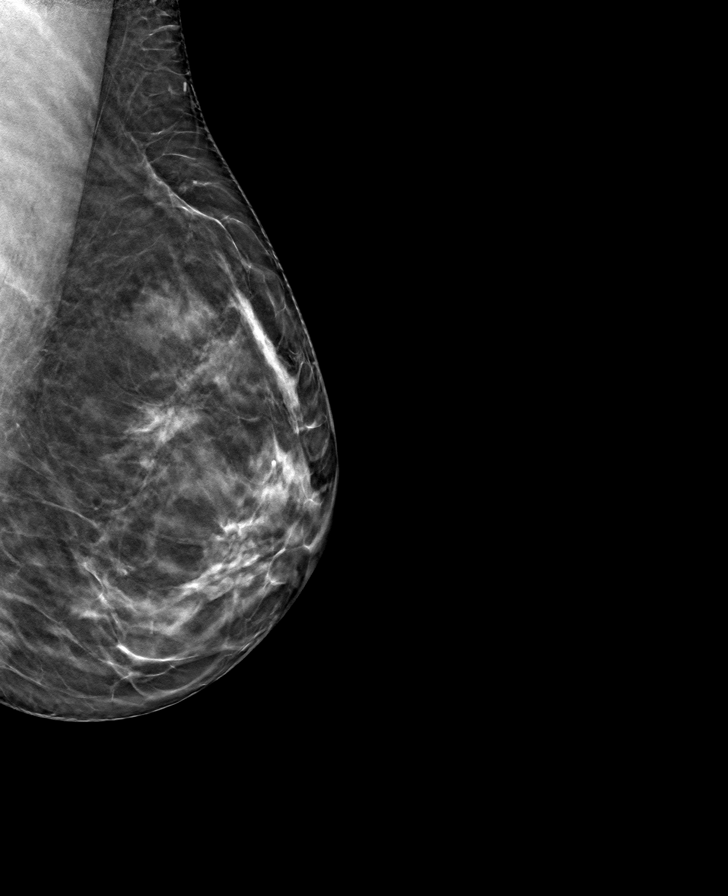

[R MLO tomo · tomo slice 31/60.0]
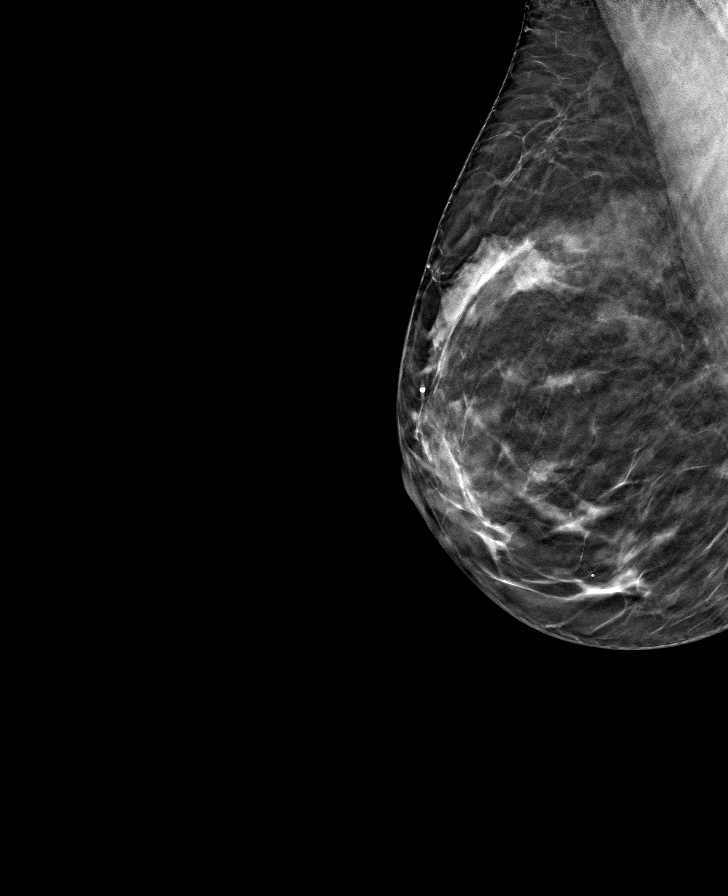

[R CC tomo · tomo slice 33/65.0]
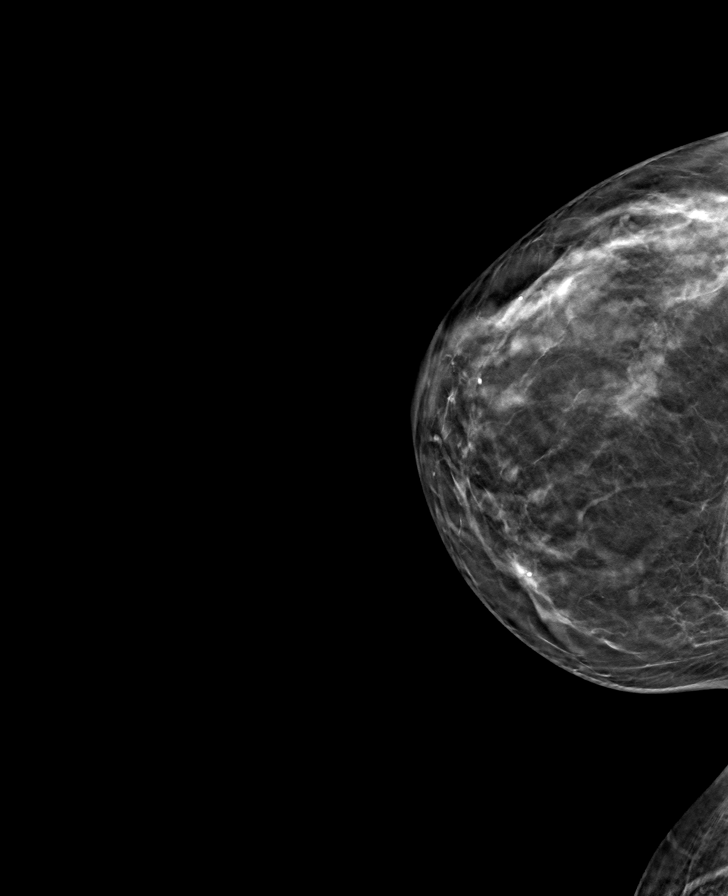

[L CC tomo · tomo slice 33/65.0]
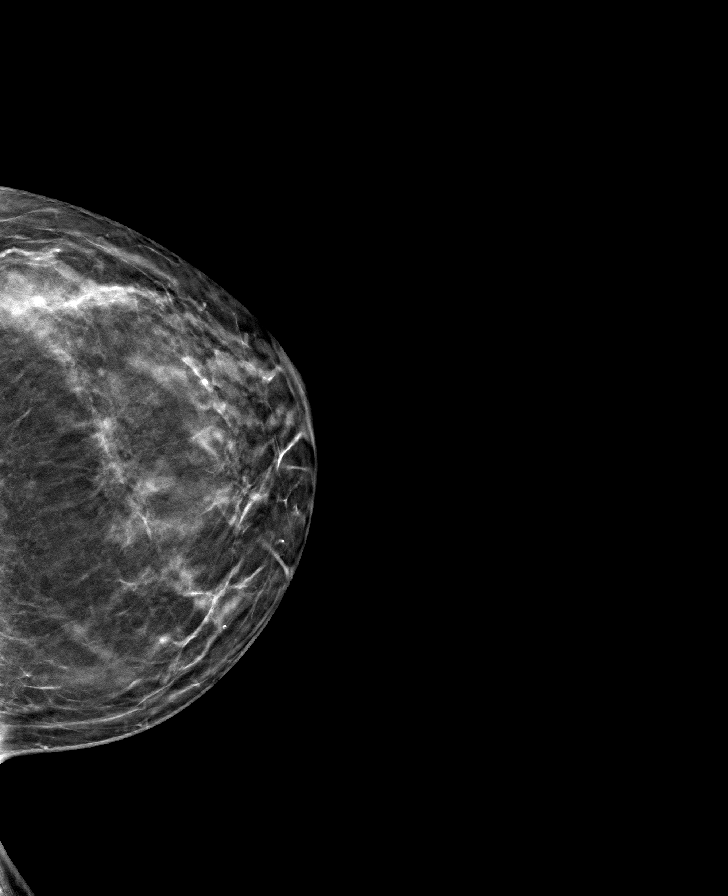

[8 of 24 positions shown; findings below may reference images not displayed]

ACR Breast Density Category c: The breast tissue is heterogeneously
dense, which may obscure small masses.
FINDINGS: There are no findings suspicious for malignancy. Images were
processed with CAD.
IMPRESSION: No mammographic evidence of malignancy. A result letter of this
screening mammogram will be mailed directly to the patient.

RECOMMENDATION:
Screening mammogram in one year. (Code:[id number])

BI-RADS CATEGORY  1: Negative.

## 2023-10-28 ENCOUNTER — Telehealth: Payer: Self-pay

## 2023-10-28 NOTE — Telephone Encounter (Signed)
Copied from CRM [id number]. Topic: Medical Record Request - Records Request >> Oct 28, 2023 11:54 AM Raven B wrote: Reason for CRM: PT wants copy of medical records from 2017-2021. Call back # [phone] email address: [email or web address]   Sent patient information how to request medical records via [facility] message.
